# Patient Record
Sex: Female | Born: 1945 | Race: White | Hispanic: No
Health system: Southern US, Community
[De-identification: ages and names within clinical notes are randomized; demographics above are authoritative.]

---

## 2005-12-15 ENCOUNTER — Emergency Department: Payer: Self-pay | Admitting: Internal Medicine

## 2006-04-29 ENCOUNTER — Other Ambulatory Visit: Payer: Self-pay

## 2006-04-30 ENCOUNTER — Other Ambulatory Visit: Payer: Self-pay

## 2006-04-30 ENCOUNTER — Inpatient Hospital Stay: Payer: Self-pay | Admitting: Internal Medicine

## 2007-08-07 ENCOUNTER — Ambulatory Visit: Payer: Self-pay | Admitting: Family Medicine

## 2007-10-08 ENCOUNTER — Ambulatory Visit: Payer: Self-pay | Admitting: Family Medicine

## 2012-09-07 LAB — CBC WITH DIFFERENTIAL/PLATELET
Basophil #: 0.1 10*3/uL (ref 0.0–0.1)
Basophil %: 0.2 %
Eosinophil #: 0 10*3/uL (ref 0.0–0.7)
HCT: 44.9 % (ref 35.0–47.0)
HGB: 14.8 g/dL (ref 12.0–16.0)
Lymphocyte #: 0.6 10*3/uL — ABNORMAL LOW (ref 1.0–3.6)
Lymphocyte %: 2.7 %
MCH: 32.1 pg (ref 26.0–34.0)
MCHC: 32.9 g/dL (ref 32.0–36.0)
MCV: 98 fL (ref 80–100)
Monocyte %: 6.2 %
Neutrophil #: 18.9 10*3/uL — ABNORMAL HIGH (ref 1.4–6.5)
Neutrophil %: 90.9 %
RDW: 14.6 % — ABNORMAL HIGH (ref 11.5–14.5)
WBC: 20.8 10*3/uL — ABNORMAL HIGH (ref 3.6–11.0)

## 2012-09-07 LAB — COMPREHENSIVE METABOLIC PANEL
Alkaline Phosphatase: 88 U/L (ref 50–136)
Co2: 35 mmol/L — ABNORMAL HIGH (ref 21–32)
Creatinine: 0.84 mg/dL (ref 0.60–1.30)
EGFR (African American): >60
EGFR (Non-African Amer.): >60
Glucose: 167 mg/dL — ABNORMAL HIGH (ref 65–99)
Osmolality: 284 (ref 275–301)
Potassium: 3.8 mmol/L (ref 3.5–5.1)
SGPT (ALT): 39 U/L (ref 12–78)
Sodium: 140 mmol/L (ref 136–145)
Total Protein: 6.8 g/dL (ref 6.4–8.2)

## 2012-09-07 LAB — CK TOTAL AND CKMB (NOT AT ARMC): CK, Total: 59 U/L (ref 21–215)

## 2012-09-08 ENCOUNTER — Inpatient Hospital Stay: Payer: Self-pay | Admitting: Specialist

## 2012-09-08 LAB — URINALYSIS, COMPLETE
Blood: NEGATIVE
Glucose,UR: NEGATIVE mg/dL (ref 0–75)
Leukocyte Esterase: NEGATIVE
Nitrite: NEGATIVE
Ph: 7 (ref 4.5–8.0)
Protein: NEGATIVE
RBC,UR: 4 /HPF (ref 0–5)
Specific Gravity: 1.039 (ref 1.003–1.030)
Squamous Epithelial: 2
WBC UR: 1 /HPF (ref 0–5)

## 2012-09-08 LAB — T4, FREE: Free Thyroxine: 0.97 ng/dL (ref 0.76–1.46)

## 2012-09-09 LAB — CBC WITH DIFFERENTIAL/PLATELET
Basophil #: 0 10*3/uL (ref 0.0–0.1)
HCT: 41.5 % (ref 35.0–47.0)
HGB: 13.8 g/dL (ref 12.0–16.0)
Lymphocyte #: 0.3 10*3/uL — ABNORMAL LOW (ref 1.0–3.6)
Lymphocyte %: 2.5 %
MCH: 32.2 pg (ref 26.0–34.0)
MCHC: 33.2 g/dL (ref 32.0–36.0)
MCV: 97 fL (ref 80–100)
Monocyte %: 4.9 %
Neutrophil #: 11.3 10*3/uL — ABNORMAL HIGH (ref 1.4–6.5)
Neutrophil %: 92.6 %
WBC: 12.2 10*3/uL — ABNORMAL HIGH (ref 3.6–11.0)

## 2012-09-09 LAB — BASIC METABOLIC PANEL
Anion Gap: 5 — ABNORMAL LOW (ref 7–16)
BUN: 15 mg/dL (ref 7–18)
Chloride: 95 mmol/L — ABNORMAL LOW (ref 98–107)
Co2: 37 mmol/L — ABNORMAL HIGH (ref 21–32)
Glucose: 143 mg/dL — ABNORMAL HIGH (ref 65–99)
Osmolality: 277 (ref 275–301)
Sodium: 137 mmol/L (ref 136–145)

## 2012-12-25 ENCOUNTER — Ambulatory Visit: Payer: Self-pay | Admitting: Emergency Medicine

## 2013-02-14 ENCOUNTER — Inpatient Hospital Stay: Payer: Self-pay | Admitting: Family Medicine

## 2013-02-14 LAB — COMPREHENSIVE METABOLIC PANEL
Albumin: 3.1 g/dL — ABNORMAL LOW (ref 3.4–5.0)
Alkaline Phosphatase: 197 U/L — ABNORMAL HIGH (ref 50–136)
Anion Gap: 5 — ABNORMAL LOW (ref 7–16)
Bilirubin,Total: 0.7 mg/dL (ref 0.2–1.0)
Calcium, Total: 9.1 mg/dL (ref 8.5–10.1)
Chloride: 92 mmol/L — ABNORMAL LOW (ref 98–107)
Creatinine: 0.87 mg/dL (ref 0.60–1.30)
EGFR (African American): >60
Glucose: 150 mg/dL — ABNORMAL HIGH (ref 65–99)
Potassium: 2.9 mmol/L — ABNORMAL LOW (ref 3.5–5.1)

## 2013-02-14 LAB — CBC
HCT: 41.5 % (ref 35.0–47.0)
HGB: 14.3 g/dL (ref 12.0–16.0)
MCHC: 34.3 g/dL (ref 32.0–36.0)
MCV: 94 fL (ref 80–100)
RDW: 15.2 % — ABNORMAL HIGH (ref 11.5–14.5)

## 2013-02-15 LAB — CBC WITH DIFFERENTIAL/PLATELET
Basophil #: 0 10*3/uL (ref 0.0–0.1)
Basophil %: 0.1 %
Eosinophil %: 0 %
HGB: 12.7 g/dL (ref 12.0–16.0)
Lymphocyte %: 1.6 %
MCH: 32.2 pg (ref 26.0–34.0)
MCV: 96 fL (ref 80–100)
Monocyte #: 0.4 x10 3/mm (ref 0.2–0.9)
Neutrophil %: 95.6 %
Platelet: 314 10*3/uL (ref 150–440)
RBC: 3.94 10*6/uL (ref 3.80–5.20)
WBC: 13.9 10*3/uL — ABNORMAL HIGH (ref 3.6–11.0)

## 2013-02-15 LAB — BASIC METABOLIC PANEL
Anion Gap: 5 — ABNORMAL LOW (ref 7–16)
Chloride: 101 mmol/L (ref 98–107)
EGFR (African American): >60
EGFR (Non-African Amer.): >60
Osmolality: 280 (ref 275–301)
Potassium: 4.4 mmol/L (ref 3.5–5.1)

## 2013-02-17 LAB — BASIC METABOLIC PANEL
BUN: 16 mg/dL (ref 7–18)
Calcium, Total: 8.8 mg/dL (ref 8.5–10.1)
Chloride: 101 mmol/L (ref 98–107)
Co2: 34 mmol/L — ABNORMAL HIGH (ref 21–32)
EGFR (African American): >60
EGFR (Non-African Amer.): >60
Glucose: 137 mg/dL — ABNORMAL HIGH (ref 65–99)
Osmolality: 279 (ref 275–301)
Potassium: 4.5 mmol/L (ref 3.5–5.1)
Sodium: 138 mmol/L (ref 136–145)

## 2013-02-17 LAB — CBC WITH DIFFERENTIAL/PLATELET
Basophil %: 0.1 %
Eosinophil #: 0 10*3/uL (ref 0.0–0.7)
Eosinophil %: 0 %
HCT: 37.7 % (ref 35.0–47.0)
HGB: 12.8 g/dL (ref 12.0–16.0)
Lymphocyte #: 0.2 10*3/uL — ABNORMAL LOW (ref 1.0–3.6)
MCHC: 33.8 g/dL (ref 32.0–36.0)
Monocyte %: 2.8 %
Neutrophil #: 13.4 10*3/uL — ABNORMAL HIGH (ref 1.4–6.5)
RBC: 3.97 10*6/uL (ref 3.80–5.20)

## 2013-02-17 LAB — PRO B NATRIURETIC PEPTIDE: B-Type Natriuretic Peptide: 2290 pg/mL — ABNORMAL HIGH (ref 0–125)

## 2013-02-19 LAB — CBC WITH DIFFERENTIAL/PLATELET
Basophil #: 0 10*3/uL (ref 0.0–0.1)
Basophil %: 0.2 %
Eosinophil #: 0 10*3/uL (ref 0.0–0.7)
HCT: 37.5 % (ref 35.0–47.0)
HGB: 12.7 g/dL (ref 12.0–16.0)
Lymphocyte #: 0.8 10*3/uL — ABNORMAL LOW (ref 1.0–3.6)
MCHC: 33.8 g/dL (ref 32.0–36.0)
MCV: 94 fL (ref 80–100)
Monocyte #: 0.8 x10 3/mm (ref 0.2–0.9)
Monocyte %: 6 %
Neutrophil #: 11.4 10*3/uL — ABNORMAL HIGH (ref 1.4–6.5)
Neutrophil %: 87.9 %
Platelet: 269 10*3/uL (ref 150–440)
RDW: 15.5 % — ABNORMAL HIGH (ref 11.5–14.5)
WBC: 12.9 10*3/uL — ABNORMAL HIGH (ref 3.6–11.0)

## 2013-02-19 LAB — BASIC METABOLIC PANEL
Anion Gap: 5 — ABNORMAL LOW (ref 7–16)
BUN: 17 mg/dL (ref 7–18)
Calcium, Total: 8.9 mg/dL (ref 8.5–10.1)
Chloride: 102 mmol/L (ref 98–107)
Co2: 32 mmol/L (ref 21–32)
EGFR (African American): >60
EGFR (Non-African Amer.): >60
Glucose: 102 mg/dL — ABNORMAL HIGH (ref 65–99)
Osmolality: 279 (ref 275–301)
Potassium: 3.5 mmol/L (ref 3.5–5.1)
Sodium: 139 mmol/L (ref 136–145)

## 2013-02-20 LAB — PATHOLOGY REPORT

## 2013-03-05 ENCOUNTER — Inpatient Hospital Stay: Payer: Self-pay | Admitting: Orthopedic Surgery

## 2013-03-05 LAB — URINALYSIS, COMPLETE
Bacteria: NONE SEEN
Bilirubin,UR: NEGATIVE
Blood: NEGATIVE
Glucose,UR: NEGATIVE mg/dL (ref 0–75)
Hyaline Cast: 2
Ketone: NEGATIVE
Leukocyte Esterase: NEGATIVE
RBC,UR: 7 /HPF (ref 0–5)

## 2013-03-05 LAB — CBC
HCT: 32.6 % — ABNORMAL LOW (ref 35.0–47.0)
HGB: 11.2 g/dL — ABNORMAL LOW (ref 12.0–16.0)
MCHC: 34.4 g/dL (ref 32.0–36.0)
MCV: 96 fL (ref 80–100)
RDW: 15.2 % — ABNORMAL HIGH (ref 11.5–14.5)

## 2013-03-05 LAB — COMPREHENSIVE METABOLIC PANEL
Alkaline Phosphatase: 174 U/L — ABNORMAL HIGH (ref 50–136)
Bilirubin,Total: 0.8 mg/dL (ref 0.2–1.0)
Calcium, Total: 9 mg/dL (ref 8.5–10.1)
Co2: 31 mmol/L (ref 21–32)
EGFR (Non-African Amer.): >60
Glucose: 139 mg/dL — ABNORMAL HIGH (ref 65–99)
SGPT (ALT): 26 U/L (ref 12–78)

## 2013-03-05 LAB — TROPONIN I: Troponin-I: <0.02 ng/mL

## 2013-03-05 LAB — PROTIME-INR: INR: 1

## 2013-03-06 LAB — CBC WITH DIFFERENTIAL/PLATELET
Basophil %: 0.2 %
Eosinophil %: 0.8 %
HCT: 29.3 % — ABNORMAL LOW (ref 35.0–47.0)
Lymphocyte #: 0.7 10*3/uL — ABNORMAL LOW (ref 1.0–3.6)
Lymphocyte %: 8.8 %
MCH: 32.5 pg (ref 26.0–34.0)
MCHC: 33.7 g/dL (ref 32.0–36.0)
MCV: 97 fL (ref 80–100)
Monocyte #: 0.6 x10 3/mm (ref 0.2–0.9)
Monocyte %: 6.7 %
Neutrophil #: 7 10*3/uL — ABNORMAL HIGH (ref 1.4–6.5)
Neutrophil %: 83.5 %
RBC: 3.04 10*6/uL — ABNORMAL LOW (ref 3.80–5.20)
RDW: 15.1 % — ABNORMAL HIGH (ref 11.5–14.5)
WBC: 8.4 10*3/uL (ref 3.6–11.0)

## 2013-03-06 LAB — TROPONIN I: Troponin-I: <0.02 ng/mL

## 2013-03-06 LAB — BASIC METABOLIC PANEL
Anion Gap: 4 — ABNORMAL LOW (ref 7–16)
Chloride: 106 mmol/L (ref 98–107)
Co2: 33 mmol/L — ABNORMAL HIGH (ref 21–32)
Creatinine: 0.66 mg/dL (ref 0.60–1.30)
EGFR (African American): >60
Glucose: 98 mg/dL (ref 65–99)
Sodium: 143 mmol/L (ref 136–145)

## 2013-03-07 LAB — PATHOLOGY REPORT

## 2013-03-07 LAB — CBC WITH DIFFERENTIAL/PLATELET
Eosinophil #: 0 10*3/uL (ref 0.0–0.7)
Eosinophil %: 0 %
HCT: 25.6 % — ABNORMAL LOW (ref 35.0–47.0)
HGB: 8.9 g/dL — ABNORMAL LOW (ref 12.0–16.0)
Lymphocyte #: 0.3 10*3/uL — ABNORMAL LOW (ref 1.0–3.6)
Lymphocyte %: 2.7 %
MCV: 96 fL (ref 80–100)
Monocyte #: 0.6 x10 3/mm (ref 0.2–0.9)
Platelet: 191 10*3/uL (ref 150–440)
WBC: 10.2 10*3/uL (ref 3.6–11.0)

## 2013-03-07 LAB — BASIC METABOLIC PANEL
Anion Gap: 4 — ABNORMAL LOW (ref 7–16)
Calcium, Total: 8.4 mg/dL — ABNORMAL LOW (ref 8.5–10.1)
Chloride: 106 mmol/L (ref 98–107)
Co2: 31 mmol/L (ref 21–32)
EGFR (African American): >60
Potassium: 4.4 mmol/L (ref 3.5–5.1)
Sodium: 141 mmol/L (ref 136–145)

## 2013-03-08 LAB — HEMOGLOBIN: HGB: 7.7 g/dL — ABNORMAL LOW (ref 12.0–16.0)

## 2013-04-30 ENCOUNTER — Ambulatory Visit: Payer: Self-pay | Admitting: Internal Medicine

## 2013-05-05 ENCOUNTER — Emergency Department: Payer: Self-pay | Admitting: Emergency Medicine

## 2013-05-05 LAB — CBC
HCT: 42.2 % (ref 35.0–47.0)
MCH: 31.2 pg (ref 26.0–34.0)
MCHC: 33.2 g/dL (ref 32.0–36.0)
MCV: 94 fL (ref 80–100)
Platelet: 329 10*3/uL (ref 150–440)
WBC: 10 10*3/uL (ref 3.6–11.0)

## 2013-05-05 LAB — COMPREHENSIVE METABOLIC PANEL
Albumin: 3.4 g/dL (ref 3.4–5.0)
Alkaline Phosphatase: 150 U/L — ABNORMAL HIGH
Anion Gap: 8 (ref 7–16)
BUN: 12 mg/dL (ref 7–18)
Calcium, Total: 9.2 mg/dL (ref 8.5–10.1)
Chloride: 95 mmol/L — ABNORMAL LOW (ref 98–107)
EGFR (African American): >60
Osmolality: 275 (ref 275–301)
SGOT(AST): 9 U/L — ABNORMAL LOW (ref 15–37)
SGPT (ALT): 18 U/L (ref 12–78)

## 2013-05-05 LAB — CK TOTAL AND CKMB (NOT AT ARMC): CK, Total: 50 U/L (ref 21–215)

## 2013-05-05 LAB — TROPONIN I: Troponin-I: <0.02 ng/mL

## 2013-05-19 ENCOUNTER — Inpatient Hospital Stay: Payer: Self-pay | Admitting: Student

## 2013-05-19 DIAGNOSIS — I498 Other specified cardiac arrhythmias: Secondary | ICD-10-CM

## 2013-05-19 LAB — MAGNESIUM: Magnesium: 1.9 mg/dL

## 2013-05-19 LAB — CK TOTAL AND CKMB (NOT AT ARMC)
CK-MB: 4.1 ng/mL — ABNORMAL HIGH (ref 0.5–3.6)
CK-MB: 4.8 ng/mL — ABNORMAL HIGH (ref 0.5–3.6)

## 2013-05-19 LAB — CBC
HCT: 45.1 % (ref 35.0–47.0)
HGB: 14.3 g/dL (ref 12.0–16.0)
MCH: 29.6 pg (ref 26.0–34.0)
MCHC: 31.7 g/dL — ABNORMAL LOW (ref 32.0–36.0)
MCV: 94 fL (ref 80–100)
RDW: 14.8 % — ABNORMAL HIGH (ref 11.5–14.5)
WBC: 13.2 10*3/uL — ABNORMAL HIGH (ref 3.6–11.0)

## 2013-05-19 LAB — TROPONIN I
Troponin-I: <0.02 ng/mL
Troponin-I: 0.02 ng/mL
Troponin-I: <0.02 ng/mL

## 2013-05-19 LAB — BASIC METABOLIC PANEL
Calcium, Total: 9 mg/dL (ref 8.5–10.1)
EGFR (African American): >60
EGFR (Non-African Amer.): >60
Osmolality: 282 (ref 275–301)
Potassium: 2.8 mmol/L — ABNORMAL LOW (ref 3.5–5.1)

## 2013-05-19 LAB — POTASSIUM
Potassium: 2.9 mmol/L — ABNORMAL LOW (ref 3.5–5.1)
Potassium: 3.2 mmol/L — ABNORMAL LOW (ref 3.5–5.1)

## 2013-05-20 LAB — BASIC METABOLIC PANEL
Anion Gap: 3 — ABNORMAL LOW (ref 7–16)
BUN: 12 mg/dL (ref 7–18)
Co2: 35 mmol/L — ABNORMAL HIGH (ref 21–32)
Creatinine: 0.66 mg/dL (ref 0.60–1.30)
EGFR (African American): >60
EGFR (Non-African Amer.): >60
Glucose: 179 mg/dL — ABNORMAL HIGH (ref 65–99)
Osmolality: 284 (ref 275–301)
Potassium: 3.6 mmol/L (ref 3.5–5.1)

## 2013-05-20 LAB — CBC WITH DIFFERENTIAL/PLATELET
Eosinophil #: 0 10*3/uL (ref 0.0–0.7)
Eosinophil %: 0 %
HCT: 39.7 % (ref 35.0–47.0)
HGB: 13 g/dL (ref 12.0–16.0)
Lymphocyte #: 0.5 10*3/uL — ABNORMAL LOW (ref 1.0–3.6)
Lymphocyte %: 4.1 %
MCH: 31 pg (ref 26.0–34.0)
MCHC: 32.8 g/dL (ref 32.0–36.0)
MCV: 94 fL (ref 80–100)
Monocyte %: 4 %
Neutrophil #: 10.7 10*3/uL — ABNORMAL HIGH (ref 1.4–6.5)
Neutrophil %: 91.7 %
Platelet: 308 10*3/uL (ref 150–440)
RDW: 15 % — ABNORMAL HIGH (ref 11.5–14.5)

## 2013-05-20 LAB — MAGNESIUM: Magnesium: 1.9 mg/dL

## 2013-05-20 LAB — TSH: Thyroid Stimulating Horm: 0.327 u[IU]/mL — ABNORMAL LOW

## 2013-05-21 DIAGNOSIS — J9600 Acute respiratory failure, unspecified whether with hypoxia or hypercapnia: Secondary | ICD-10-CM

## 2013-05-21 DIAGNOSIS — J441 Chronic obstructive pulmonary disease with (acute) exacerbation: Secondary | ICD-10-CM

## 2013-05-21 LAB — EXPECTORATED SPUTUM ASSESSMENT W REFEX TO RESP CULTURE

## 2013-05-22 LAB — CBC WITH DIFFERENTIAL/PLATELET
Basophil #: 0.1 10*3/uL (ref 0.0–0.1)
Basophil %: 0.7 %
Eosinophil #: 0 10*3/uL (ref 0.0–0.7)
HCT: 36 % (ref 35.0–47.0)
HGB: 11.8 g/dL — ABNORMAL LOW (ref 12.0–16.0)
Lymphocyte #: 0.2 10*3/uL — ABNORMAL LOW (ref 1.0–3.6)
Lymphocyte %: 1.7 %
MCH: 30.9 pg (ref 26.0–34.0)
MCHC: 32.9 g/dL (ref 32.0–36.0)
Monocyte #: 0.3 x10 3/mm (ref 0.2–0.9)
Monocyte %: 2.3 %
Neutrophil #: 11.1 10*3/uL — ABNORMAL HIGH (ref 1.4–6.5)
Neutrophil %: 95.3 %
Platelet: 191 10*3/uL (ref 150–440)
RBC: 3.84 10*6/uL (ref 3.80–5.20)
RDW: 14.6 % — ABNORMAL HIGH (ref 11.5–14.5)
WBC: 11.6 10*3/uL — ABNORMAL HIGH (ref 3.6–11.0)

## 2013-05-22 LAB — BASIC METABOLIC PANEL
Anion Gap: 2 — ABNORMAL LOW (ref 7–16)
BUN: 27 mg/dL — ABNORMAL HIGH (ref 7–18)
Calcium, Total: 8.7 mg/dL (ref 8.5–10.1)
Co2: 38 mmol/L — ABNORMAL HIGH (ref 21–32)
Creatinine: 0.49 mg/dL — ABNORMAL LOW (ref 0.60–1.30)
EGFR (African American): >60
EGFR (Non-African Amer.): >60
Glucose: 188 mg/dL — ABNORMAL HIGH (ref 65–99)
Potassium: 4.3 mmol/L (ref 3.5–5.1)
Sodium: 139 mmol/L (ref 136–145)

## 2013-05-22 LAB — MAGNESIUM: Magnesium: 2.1 mg/dL

## 2013-05-23 LAB — CBC WITH DIFFERENTIAL/PLATELET
Basophil #: 0 10*3/uL (ref 0.0–0.1)
Basophil %: 0.1 %
Eosinophil #: 0 10*3/uL (ref 0.0–0.7)
Eosinophil %: 0 %
HCT: 37.3 % (ref 35.0–47.0)
HGB: 12 g/dL (ref 12.0–16.0)
Lymphocyte #: 0.1 10*3/uL — ABNORMAL LOW (ref 1.0–3.6)
Lymphocyte %: 1.6 %
MCHC: 32.1 g/dL (ref 32.0–36.0)
MCV: 93 fL (ref 80–100)
Monocyte #: 0.2 x10 3/mm (ref 0.2–0.9)
Monocyte %: 2.4 %
Neutrophil #: 8.5 10*3/uL — ABNORMAL HIGH (ref 1.4–6.5)
Neutrophil %: 95.9 %
WBC: 8.9 10*3/uL (ref 3.6–11.0)

## 2013-05-23 LAB — BASIC METABOLIC PANEL
BUN: 28 mg/dL — ABNORMAL HIGH (ref 7–18)
Calcium, Total: 7.9 mg/dL — ABNORMAL LOW (ref 8.5–10.1)
Chloride: 100 mmol/L (ref 98–107)
Co2: 37 mmol/L — ABNORMAL HIGH (ref 21–32)
Creatinine: 0.52 mg/dL — ABNORMAL LOW (ref 0.60–1.30)
EGFR (Non-African Amer.): >60
Glucose: 201 mg/dL — ABNORMAL HIGH (ref 65–99)
Osmolality: 289 (ref 275–301)
Potassium: 4.1 mmol/L (ref 3.5–5.1)

## 2013-05-24 LAB — CBC WITH DIFFERENTIAL/PLATELET
Eosinophil %: 0 %
HCT: 34.5 % — ABNORMAL LOW (ref 35.0–47.0)
HGB: 11.5 g/dL — ABNORMAL LOW (ref 12.0–16.0)
Lymphocyte #: 0.1 10*3/uL — ABNORMAL LOW (ref 1.0–3.6)
MCH: 31.3 pg (ref 26.0–34.0)
MCHC: 33.3 g/dL (ref 32.0–36.0)
MCV: 94 fL (ref 80–100)
Monocyte %: 3 %
RDW: 15 % — ABNORMAL HIGH (ref 11.5–14.5)
WBC: 12.6 10*3/uL — ABNORMAL HIGH (ref 3.6–11.0)

## 2013-05-24 LAB — BASIC METABOLIC PANEL
Anion Gap: 4 — ABNORMAL LOW (ref 7–16)
BUN: 25 mg/dL — ABNORMAL HIGH (ref 7–18)
Chloride: 100 mmol/L (ref 98–107)
Chloride: 99 mmol/L (ref 98–107)
Co2: 37 mmol/L — ABNORMAL HIGH (ref 21–32)
Co2: 40 mmol/L (ref 21–32)
Creatinine: 0.39 mg/dL — ABNORMAL LOW (ref 0.60–1.30)
Creatinine: 0.42 mg/dL — ABNORMAL LOW (ref 0.60–1.30)
EGFR (African American): >60
EGFR (Non-African Amer.): >60
Osmolality: 290 (ref 275–301)
Potassium: 3.5 mmol/L (ref 3.5–5.1)
Sodium: 140 mmol/L (ref 136–145)

## 2013-05-24 LAB — MAGNESIUM
Magnesium: 2.1 mg/dL
Magnesium: 2.3 mg/dL

## 2013-05-24 LAB — CULTURE, BLOOD (SINGLE)

## 2013-05-24 LAB — PHOSPHORUS: Phosphorus: 3.3 mg/dL (ref 2.5–4.9)

## 2013-05-25 LAB — CBC WITH DIFFERENTIAL/PLATELET
Basophil #: 0 10*3/uL (ref 0.0–0.1)
Eosinophil #: 0 10*3/uL (ref 0.0–0.7)
Eosinophil %: 0 %
HCT: 34.5 % — ABNORMAL LOW (ref 35.0–47.0)
MCH: 31 pg (ref 26.0–34.0)
MCHC: 33.1 g/dL (ref 32.0–36.0)
MCV: 94 fL (ref 80–100)
Monocyte %: 3.1 %
RBC: 3.69 10*6/uL — ABNORMAL LOW (ref 3.80–5.20)
RDW: 14.8 % — ABNORMAL HIGH (ref 11.5–14.5)

## 2013-05-25 LAB — BASIC METABOLIC PANEL
Anion Gap: 0 — ABNORMAL LOW (ref 7–16)
BUN: 34 mg/dL — ABNORMAL HIGH (ref 7–18)
Calcium, Total: 8.3 mg/dL — ABNORMAL LOW (ref 8.5–10.1)
Chloride: 99 mmol/L (ref 98–107)
Co2: 41 mmol/L (ref 21–32)
Creatinine: 0.42 mg/dL — ABNORMAL LOW (ref 0.60–1.30)
EGFR (African American): >60
EGFR (Non-African Amer.): >60
Glucose: 156 mg/dL — ABNORMAL HIGH (ref 65–99)
Potassium: 4.4 mmol/L (ref 3.5–5.1)
Sodium: 140 mmol/L (ref 136–145)

## 2013-05-25 LAB — MAGNESIUM: Magnesium: 2.1 mg/dL

## 2013-05-26 DIAGNOSIS — I498 Other specified cardiac arrhythmias: Secondary | ICD-10-CM

## 2013-05-26 DIAGNOSIS — J441 Chronic obstructive pulmonary disease with (acute) exacerbation: Secondary | ICD-10-CM

## 2013-05-26 DIAGNOSIS — J9620 Acute and chronic respiratory failure, unspecified whether with hypoxia or hypercapnia: Secondary | ICD-10-CM

## 2013-05-26 LAB — ALBUMIN: Albumin: 2.4 g/dL — ABNORMAL LOW (ref 3.4–5.0)

## 2013-05-26 LAB — PHOSPHORUS: Phosphorus: 3.3 mg/dL (ref 2.5–4.9)

## 2013-05-27 LAB — MAGNESIUM: Magnesium: 2 mg/dL

## 2013-05-27 LAB — SODIUM: Sodium: 139 mmol/L (ref 136–145)

## 2013-05-28 LAB — CBC WITH DIFFERENTIAL/PLATELET
Basophil %: 0.6 %
Eosinophil %: 0.1 %
HCT: 39.8 % (ref 35.0–47.0)
HGB: 13.1 g/dL (ref 12.0–16.0)
Lymphocyte #: 0.1 10*3/uL — ABNORMAL LOW (ref 1.0–3.6)
Lymphocyte %: 0.8 %
MCH: 31 pg (ref 26.0–34.0)
MCHC: 32.9 g/dL (ref 32.0–36.0)
MCV: 94 fL (ref 80–100)
Monocyte #: 0.7 x10 3/mm (ref 0.2–0.9)
Neutrophil #: 16.6 10*3/uL — ABNORMAL HIGH (ref 1.4–6.5)
Neutrophil %: 94.6 %
Platelet: 179 10*3/uL (ref 150–440)
RBC: 4.23 10*6/uL (ref 3.80–5.20)

## 2013-05-28 LAB — BASIC METABOLIC PANEL
BUN: 26 mg/dL — ABNORMAL HIGH (ref 7–18)
Calcium, Total: 8.3 mg/dL — ABNORMAL LOW (ref 8.5–10.1)
Chloride: 99 mmol/L (ref 98–107)
Co2: 36 mmol/L — ABNORMAL HIGH (ref 21–32)
Creatinine: 0.41 mg/dL — ABNORMAL LOW (ref 0.60–1.30)
EGFR (African American): >60
EGFR (Non-African Amer.): >60
Glucose: 168 mg/dL — ABNORMAL HIGH (ref 65–99)
Osmolality: 284 (ref 275–301)
Potassium: 3.5 mmol/L (ref 3.5–5.1)

## 2013-05-28 LAB — PHOSPHORUS: Phosphorus: 3.5 mg/dL (ref 2.5–4.9)

## 2013-05-29 LAB — MAGNESIUM: Magnesium: 2.1 mg/dL

## 2013-05-29 LAB — PHOSPHORUS: Phosphorus: 3.3 mg/dL (ref 2.5–4.9)

## 2013-05-30 LAB — BASIC METABOLIC PANEL
Anion Gap: 4 — ABNORMAL LOW (ref 7–16)
BUN: 25 mg/dL — ABNORMAL HIGH (ref 7–18)
Calcium, Total: 8.4 mg/dL — ABNORMAL LOW (ref 8.5–10.1)
Chloride: 97 mmol/L — ABNORMAL LOW (ref 98–107)
Co2: 39 mmol/L — ABNORMAL HIGH (ref 21–32)
Creatinine: 0.41 mg/dL — ABNORMAL LOW (ref 0.60–1.30)
EGFR (African American): >60
EGFR (Non-African Amer.): >60
Osmolality: 292 (ref 275–301)
Sodium: 140 mmol/L (ref 136–145)

## 2013-05-30 LAB — PHOSPHORUS: Phosphorus: 3.5 mg/dL (ref 2.5–4.9)

## 2013-05-30 LAB — CBC WITH DIFFERENTIAL/PLATELET
Basophil #: 0.1 10*3/uL (ref 0.0–0.1)
Basophil %: 0.5 %
Eosinophil %: 0 %
HGB: 12.8 g/dL (ref 12.0–16.0)
Lymphocyte %: 0.5 %
MCH: 31 pg (ref 26.0–34.0)
Monocyte %: 2 %
Neutrophil #: 22.4 10*3/uL — ABNORMAL HIGH (ref 1.4–6.5)
Neutrophil %: 97 %
RBC: 4.14 10*6/uL (ref 3.80–5.20)
RDW: 15.5 % — ABNORMAL HIGH (ref 11.5–14.5)
WBC: 23.1 10*3/uL — ABNORMAL HIGH (ref 3.6–11.0)

## 2013-05-30 LAB — MAGNESIUM: Magnesium: 2.2 mg/dL

## 2013-05-30 LAB — POTASSIUM: Potassium: 4.7 mmol/L (ref 3.5–5.1)

## 2013-05-31 ENCOUNTER — Ambulatory Visit: Payer: Self-pay | Admitting: Internal Medicine

## 2013-05-31 DIAGNOSIS — I498 Other specified cardiac arrhythmias: Secondary | ICD-10-CM

## 2013-05-31 DIAGNOSIS — J441 Chronic obstructive pulmonary disease with (acute) exacerbation: Secondary | ICD-10-CM

## 2013-05-31 DIAGNOSIS — J9620 Acute and chronic respiratory failure, unspecified whether with hypoxia or hypercapnia: Secondary | ICD-10-CM

## 2013-06-01 LAB — BASIC METABOLIC PANEL
Anion Gap: 4 — ABNORMAL LOW (ref 7–16)
BUN: 26 mg/dL — ABNORMAL HIGH (ref 7–18)
CALCIUM: 8.9 mg/dL (ref 8.5–10.1)
CHLORIDE: 89 mmol/L — AB (ref 98–107)
CO2: 44 mmol/L — AB (ref 21–32)
CREATININE: 0.33 mg/dL — AB (ref 0.60–1.30)
EGFR (African American): >60
EGFR (Non-African Amer.): >60
GLUCOSE: 199 mg/dL — AB (ref 65–99)
Osmolality: 284 (ref 275–301)
Potassium: 3.8 mmol/L (ref 3.5–5.1)
Sodium: 137 mmol/L (ref 136–145)

## 2013-06-01 LAB — MAGNESIUM: MAGNESIUM: 2.1 mg/dL

## 2013-06-01 LAB — PHOSPHORUS: Phosphorus: 3.4 mg/dL (ref 2.5–4.9)

## 2013-06-02 DIAGNOSIS — I5033 Acute on chronic diastolic (congestive) heart failure: Secondary | ICD-10-CM

## 2013-06-02 LAB — CBC WITH DIFFERENTIAL/PLATELET
Basophil #: 0.2 10*3/uL — ABNORMAL HIGH (ref 0.0–0.1)
Basophil %: 1.1 %
EOS ABS: 0 10*3/uL (ref 0.0–0.7)
Eosinophil %: 0 %
HCT: 37.3 % (ref 35.0–47.0)
HGB: 12 g/dL (ref 12.0–16.0)
Lymphocyte #: 0.2 10*3/uL — ABNORMAL LOW (ref 1.0–3.6)
Lymphocyte %: 0.7 %
MCH: 30.1 pg (ref 26.0–34.0)
MCHC: 32.2 g/dL (ref 32.0–36.0)
MCV: 94 fL (ref 80–100)
MONOS PCT: 3.6 %
Monocyte #: 0.8 x10 3/mm (ref 0.2–0.9)
Neutrophil #: 20.8 10*3/uL — ABNORMAL HIGH (ref 1.4–6.5)
Neutrophil %: 94.6 %
Platelet: 212 10*3/uL (ref 150–440)
RBC: 3.99 10*6/uL (ref 3.80–5.20)
RDW: 15.2 % — AB (ref 11.5–14.5)
WBC: 22 10*3/uL — AB (ref 3.6–11.0)

## 2013-06-02 LAB — BASIC METABOLIC PANEL
BUN: 28 mg/dL — AB (ref 7–18)
CHLORIDE: 87 mmol/L — AB (ref 98–107)
Calcium, Total: 8.6 mg/dL (ref 8.5–10.1)
Creatinine: 0.35 mg/dL — ABNORMAL LOW (ref 0.60–1.30)
EGFR (Non-African Amer.): >60
Glucose: 161 mg/dL — ABNORMAL HIGH (ref 65–99)
OSMOLALITY: 283 (ref 275–301)
POTASSIUM: 3 mmol/L — AB (ref 3.5–5.1)
Sodium: 137 mmol/L (ref 136–145)

## 2013-06-02 LAB — MAGNESIUM: Magnesium: 2 mg/dL

## 2013-06-02 LAB — PHOSPHORUS: Phosphorus: 3.5 mg/dL (ref 2.5–4.9)

## 2013-06-03 LAB — CBC WITH DIFFERENTIAL/PLATELET
Basophil #: 0.1 10*3/uL (ref 0.0–0.1)
Basophil %: 0.3 %
Eosinophil #: 0 10*3/uL (ref 0.0–0.7)
Eosinophil %: 0.1 %
HCT: 42.3 % (ref 35.0–47.0)
HGB: 14 g/dL (ref 12.0–16.0)
Lymphocyte #: 0.2 10*3/uL — ABNORMAL LOW (ref 1.0–3.6)
Lymphocyte %: 0.8 %
MCH: 30.3 pg (ref 26.0–34.0)
MCHC: 33.2 g/dL (ref 32.0–36.0)
MCV: 91 fL (ref 80–100)
MONO ABS: 0.8 x10 3/mm (ref 0.2–0.9)
Monocyte %: 2.9 %
NEUTROS PCT: 95.9 %
Neutrophil #: 25.2 10*3/uL — ABNORMAL HIGH (ref 1.4–6.5)
Platelet: 253 10*3/uL (ref 150–440)
RBC: 4.63 10*6/uL (ref 3.80–5.20)
RDW: 15.3 % — ABNORMAL HIGH (ref 11.5–14.5)
WBC: 26.3 10*3/uL — ABNORMAL HIGH (ref 3.6–11.0)

## 2013-06-03 LAB — POTASSIUM: Potassium: 2.7 mmol/L — ABNORMAL LOW (ref 3.5–5.1)

## 2013-06-03 LAB — MAGNESIUM: Magnesium: 2 mg/dL

## 2013-06-03 LAB — SODIUM: Sodium: 137 mmol/L (ref 136–145)

## 2013-06-03 LAB — PHOSPHORUS: Phosphorus: 3.1 mg/dL (ref 2.5–4.9)

## 2013-06-04 LAB — CBC WITH DIFFERENTIAL/PLATELET
BASOS ABS: 0.1 10*3/uL (ref 0.0–0.1)
BASOS PCT: 0.6 %
EOS PCT: 0 %
Eosinophil #: 0 10*3/uL (ref 0.0–0.7)
HCT: 38.9 % (ref 35.0–47.0)
HGB: 12.9 g/dL (ref 12.0–16.0)
Lymphocyte #: 0.1 10*3/uL — ABNORMAL LOW (ref 1.0–3.6)
Lymphocyte %: 0.3 %
MCH: 30.8 pg (ref 26.0–34.0)
MCHC: 33.3 g/dL (ref 32.0–36.0)
MCV: 93 fL (ref 80–100)
MONO ABS: 0.3 x10 3/mm (ref 0.2–0.9)
Monocyte %: 1.6 %
NEUTROS PCT: 97.5 %
Neutrophil #: 20.9 10*3/uL — ABNORMAL HIGH (ref 1.4–6.5)
PLATELETS: 196 10*3/uL (ref 150–440)
RBC: 4.2 10*6/uL (ref 3.80–5.20)
RDW: 15.8 % — AB (ref 11.5–14.5)
WBC: 21.5 10*3/uL — ABNORMAL HIGH (ref 3.6–11.0)

## 2013-06-04 LAB — BASIC METABOLIC PANEL
Anion Gap: 2 — ABNORMAL LOW (ref 7–16)
BUN: 21 mg/dL — ABNORMAL HIGH (ref 7–18)
CALCIUM: 8.4 mg/dL — AB (ref 8.5–10.1)
CHLORIDE: 96 mmol/L — AB (ref 98–107)
Co2: 42 mmol/L (ref 21–32)
Creatinine: 0.32 mg/dL — ABNORMAL LOW (ref 0.60–1.30)
EGFR (Non-African Amer.): >60
GLUCOSE: 198 mg/dL — AB (ref 65–99)
OSMOLALITY: 288 (ref 275–301)
Potassium: 3.7 mmol/L (ref 3.5–5.1)
Sodium: 140 mmol/L (ref 136–145)

## 2013-06-04 LAB — MAGNESIUM: Magnesium: 2.2 mg/dL

## 2013-06-04 LAB — PHOSPHORUS: Phosphorus: 3.7 mg/dL (ref 2.5–4.9)

## 2013-06-05 LAB — CBC WITH DIFFERENTIAL/PLATELET
BASOS ABS: 0.1 10*3/uL (ref 0.0–0.1)
Basophil %: 0.3 %
Eosinophil #: 0 10*3/uL (ref 0.0–0.7)
Eosinophil %: 0 %
HCT: 35.7 % (ref 35.0–47.0)
HGB: 11.8 g/dL — ABNORMAL LOW (ref 12.0–16.0)
LYMPHS PCT: 0.3 %
Lymphocyte #: 0.1 10*3/uL — ABNORMAL LOW (ref 1.0–3.6)
MCH: 30.7 pg (ref 26.0–34.0)
MCHC: 33 g/dL (ref 32.0–36.0)
MCV: 93 fL (ref 80–100)
MONO ABS: 0.2 x10 3/mm (ref 0.2–0.9)
Monocyte %: 1.1 %
Neutrophil #: 20.9 10*3/uL — ABNORMAL HIGH (ref 1.4–6.5)
Neutrophil %: 98.3 %
PLATELETS: 172 10*3/uL (ref 150–440)
RBC: 3.84 10*6/uL (ref 3.80–5.20)
RDW: 15.6 % — AB (ref 11.5–14.5)
WBC: 21.3 10*3/uL — ABNORMAL HIGH (ref 3.6–11.0)

## 2013-06-05 LAB — POTASSIUM: Potassium: 3.5 mmol/L (ref 3.5–5.1)

## 2013-06-05 LAB — MAGNESIUM: MAGNESIUM: 2.1 mg/dL

## 2013-06-05 LAB — PHOSPHORUS: Phosphorus: 2.6 mg/dL (ref 2.5–4.9)

## 2013-06-06 LAB — PHOSPHORUS: Phosphorus: 2.2 mg/dL — ABNORMAL LOW (ref 2.5–4.9)

## 2013-06-06 LAB — CALCIUM: CALCIUM: 8.9 mg/dL (ref 8.5–10.1)

## 2013-06-06 LAB — MAGNESIUM: MAGNESIUM: 1.9 mg/dL

## 2013-06-06 LAB — SODIUM: Sodium: 140 mmol/L (ref 136–145)

## 2013-06-06 LAB — POTASSIUM: Potassium: 2.8 mmol/L — ABNORMAL LOW (ref 3.5–5.1)

## 2013-06-07 ENCOUNTER — Other Ambulatory Visit: Payer: Self-pay | Admitting: Family Medicine

## 2013-06-07 LAB — PRO B NATRIURETIC PEPTIDE: B-TYPE NATIURETIC PEPTID: 293 pg/mL — AB (ref 0–125)

## 2013-06-07 LAB — CBC WITH DIFFERENTIAL/PLATELET
Basophil #: 0 10*3/uL (ref 0.0–0.1)
Basophil %: 0.2 %
EOS PCT: 0.2 %
Eosinophil #: 0 10*3/uL (ref 0.0–0.7)
HCT: 36.5 % (ref 35.0–47.0)
HGB: 12.1 g/dL (ref 12.0–16.0)
Lymphocyte #: 0.1 10*3/uL — ABNORMAL LOW (ref 1.0–3.6)
Lymphocyte %: 0.9 %
MCH: 31.4 pg (ref 26.0–34.0)
MCHC: 33.1 g/dL (ref 32.0–36.0)
MCV: 95 fL (ref 80–100)
Monocyte #: 0.2 x10 3/mm (ref 0.2–0.9)
Monocyte %: 1.7 %
NEUTROS ABS: 12.6 10*3/uL — AB (ref 1.4–6.5)
Neutrophil %: 97 %
PLATELETS: 113 10*3/uL — AB (ref 150–440)
RBC: 3.85 10*6/uL (ref 3.80–5.20)
RDW: 16.3 % — ABNORMAL HIGH (ref 11.5–14.5)
WBC: 13 10*3/uL — ABNORMAL HIGH (ref 3.6–11.0)

## 2013-06-07 LAB — URINALYSIS, COMPLETE
Bilirubin,UR: NEGATIVE
Glucose,UR: >=500 mg/dL (ref 0–75)
Ketone: NEGATIVE
NITRITE: NEGATIVE
PH: 6 (ref 4.5–8.0)
RBC,UR: 558 /HPF (ref 0–5)
SPECIFIC GRAVITY: 1.02 (ref 1.003–1.030)
Squamous Epithelial: 1

## 2013-06-07 LAB — COMPREHENSIVE METABOLIC PANEL
ANION GAP: 5 — AB (ref 7–16)
Albumin: 2.5 g/dL — ABNORMAL LOW (ref 3.4–5.0)
Alkaline Phosphatase: 121 U/L — ABNORMAL HIGH
BUN: 23 mg/dL — ABNORMAL HIGH (ref 7–18)
Bilirubin,Total: 0.6 mg/dL (ref 0.2–1.0)
Calcium, Total: 8.2 mg/dL — ABNORMAL LOW (ref 8.5–10.1)
Chloride: 112 mmol/L — ABNORMAL HIGH (ref 98–107)
Co2: 35 mmol/L — ABNORMAL HIGH (ref 21–32)
Creatinine: 0.55 mg/dL — ABNORMAL LOW (ref 0.60–1.30)
EGFR (Non-African Amer.): >60
GLUCOSE: 185 mg/dL — AB (ref 65–99)
Osmolality: 310 (ref 275–301)
Potassium: 3.6 mmol/L (ref 3.5–5.1)
SGOT(AST): 14 U/L — ABNORMAL LOW (ref 15–37)
SGPT (ALT): 52 U/L (ref 12–78)
Sodium: 152 mmol/L — ABNORMAL HIGH (ref 136–145)
TOTAL PROTEIN: 5.3 g/dL — AB (ref 6.4–8.2)

## 2013-06-08 ENCOUNTER — Other Ambulatory Visit: Payer: Self-pay | Admitting: Family Medicine

## 2013-06-11 LAB — URINE CULTURE

## 2013-07-01 ENCOUNTER — Ambulatory Visit: Payer: Self-pay | Admitting: Internal Medicine

## 2013-07-01 DEATH — deceased

## 2014-03-24 IMAGING — CR DG HIP COMPLETE 2+V*L*
1 series · 2 of 2 positions shown · non-contrast
Comparison: none

REASON FOR EXAM: pain post fall
COMMENTS:

PROCEDURE:     DXR - DXR HIP LEFT COMPLETE  - March 05, 2013 [DATE]
RESULT:     There is lucency in the intratrochanteric region of the proximal
left femur consistent with a fracture without comminution or distraction at
this point. The included pelvis appears unremarkable.

[Series 1: t hip ap left · 0.14mm/px · 2 of 2 slices shown]
[im 1/2]
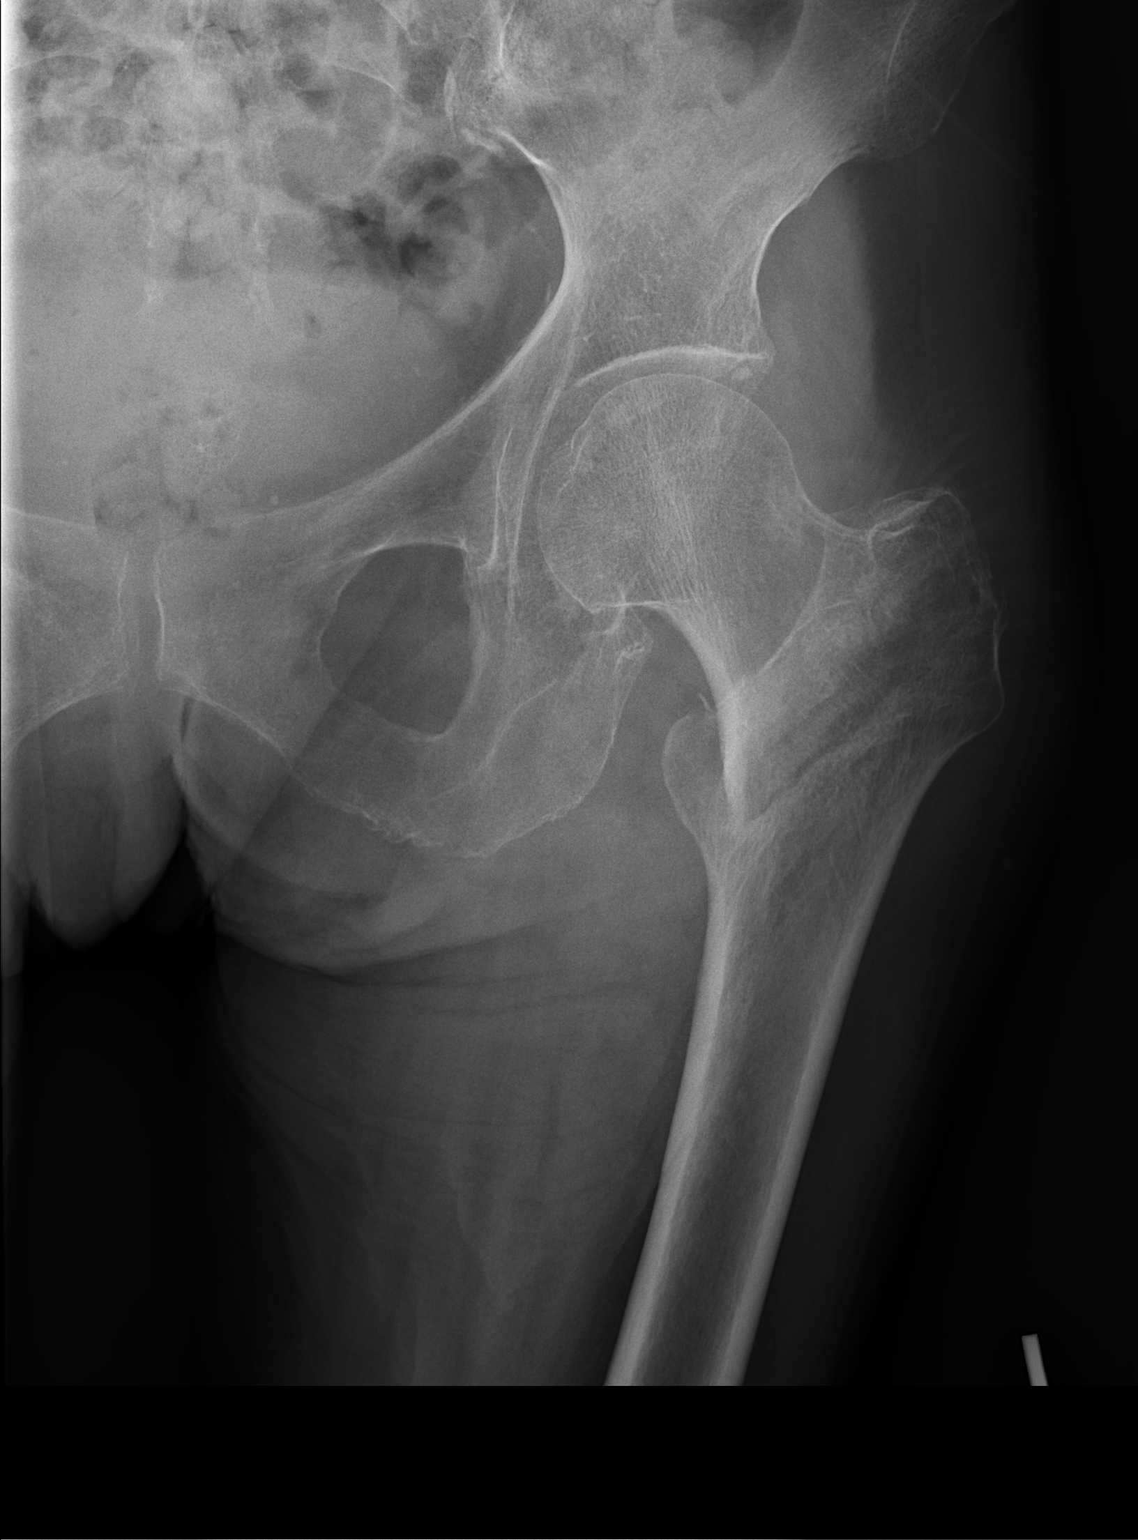
[im 2/2]
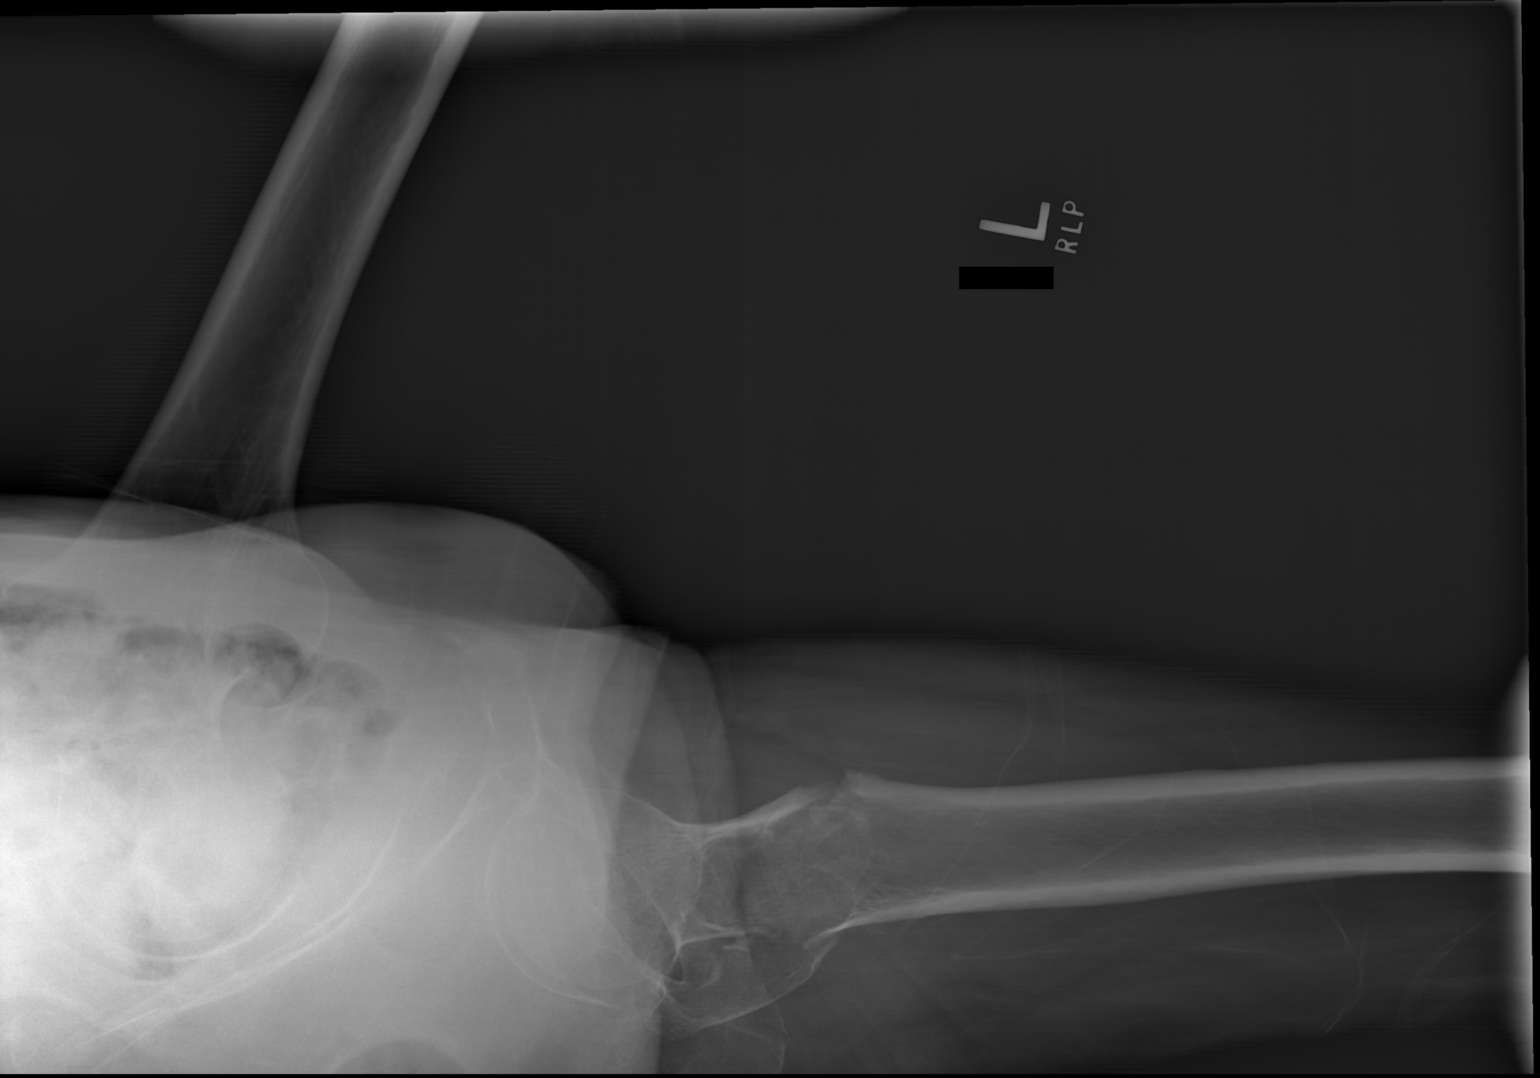

[2 of 2 positions shown; findings below may reference images not displayed]

IMPRESSION: 1. Fracture in the intratrochanteric region of the proximal left femur.

[REDACTED]

## 2014-03-26 IMAGING — CR DG CHEST 1V PORT
1 series · 1 of 1 positions shown · non-contrast
Comparison: none

REASON FOR EXAM: SOB
COMMENTS:

PROCEDURE:     DXR - DXR PORTABLE CHEST SINGLE VIEW  - March 07, 2013 [DATE]
RESULT:     Comparison: None

[ap]
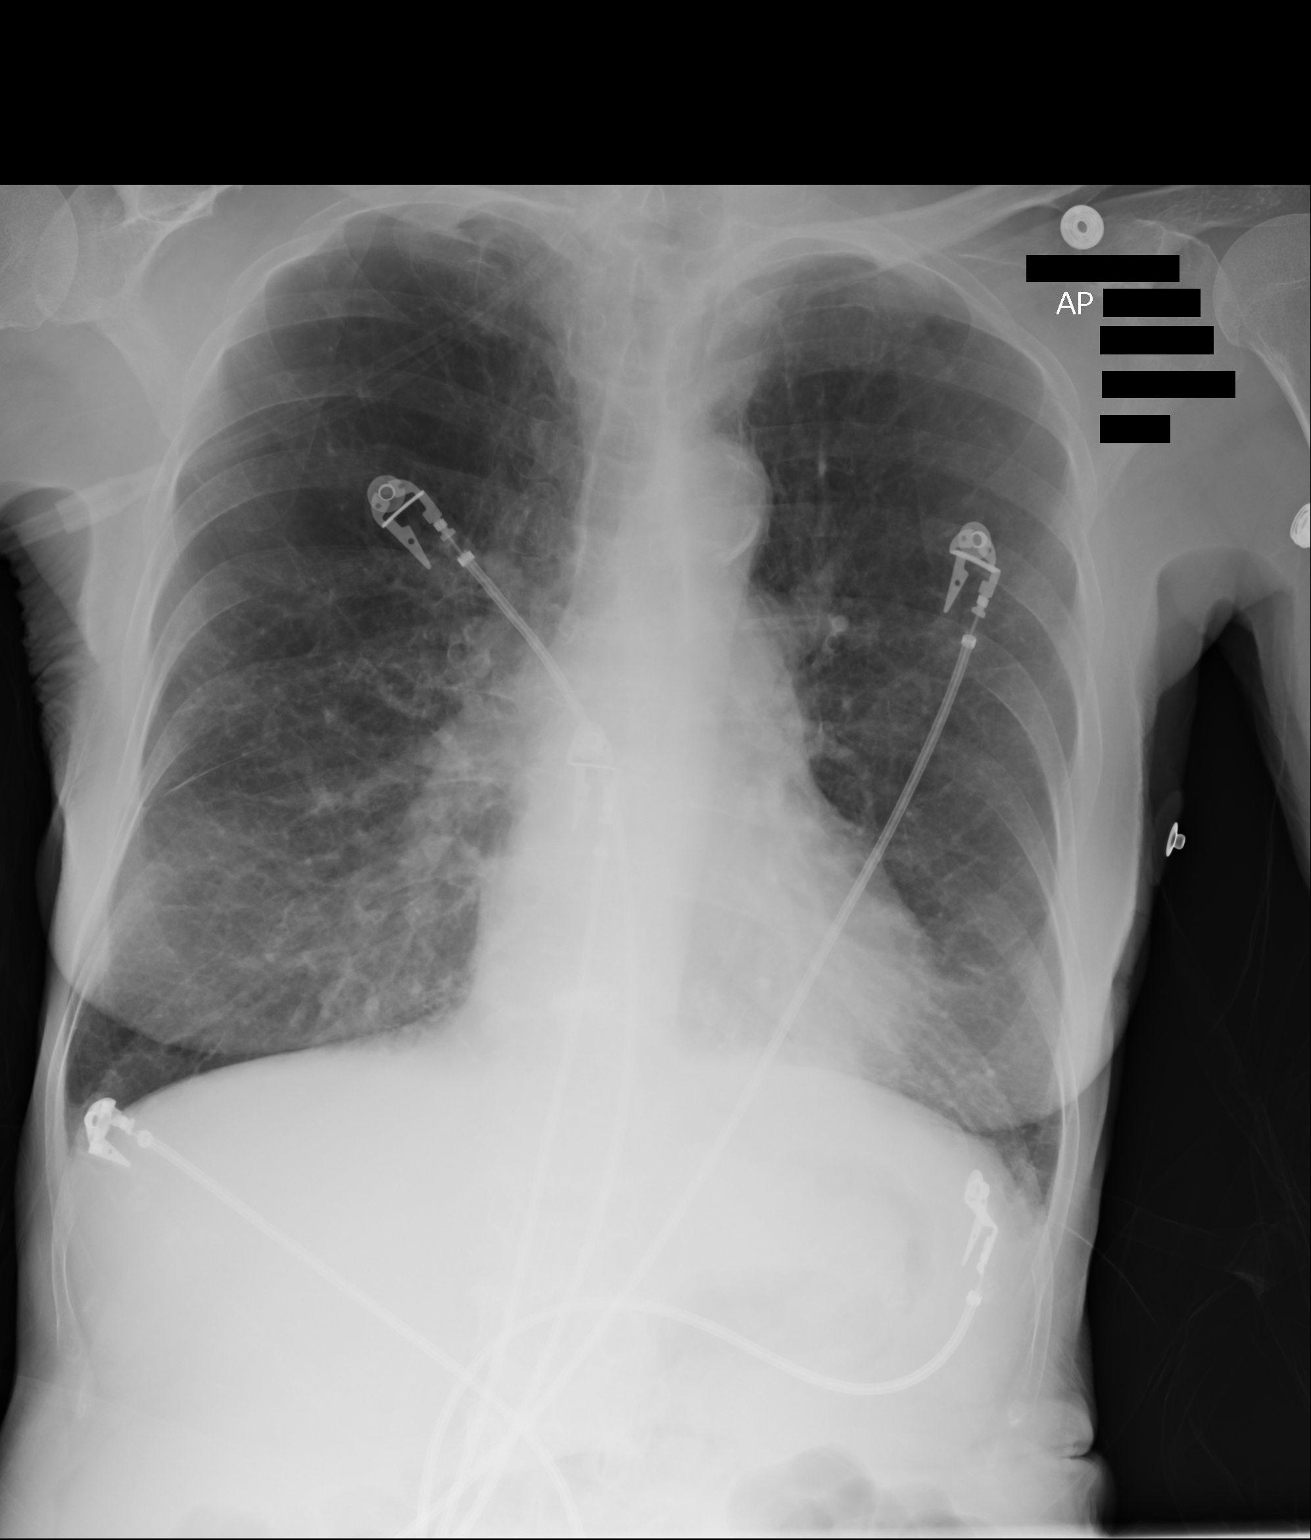

[1 of 1 positions shown; findings below may reference images not displayed]

FINDINGS: Single portable AP chest radiograph is provided. The lungs are hyperinflated
likely secondary to COPD. There is no focal parenchymal opacity, pleural
effusion, or pneumothorax. Normal cardiomediastinal silhouette. The osseous
structures are unremarkable.
IMPRESSION: No acute disease of the che[REDACTED]

## 2014-09-20 NOTE — H&P (Signed)
PATIENT NAME:  Melody RileyEACE, Jeanet MR#:  [id number] DATE OF BIRTH:  [birth year]  DATE OF ADMISSION:  09/08/2012  REFERRING PHYSICIAN:  Dr. Bayard Malesandolph Brown.  PRIMARY CARE PHYSIC[facility]  [facility]   CHIEF COMPLAINT:  Cough with productive sputum, shortness of breath.   HISTORY OF PRESENT ILLNESS:  This is a 69 year old female with history of COPD, still actively smoking, on 3 liters of home oxygen at home, presents with complaints of shortness of breath, the patient was severely hypoxic upon presentation where she required to be on BiPAP initially, where her respiratory status improved after that, where currently she is back on 3 liters nasal cannula where she tolerates that, the patient had chest x-ray which did show a right lung infiltrate as well, where she was started on IV Levaquin, the patient reports she is known to have history of COPD, does not have a primary pulmonary physician where she is followed, reports she was restarted before three weeks on a prednisone tapering dose by her PCP, but reports given the fact she has not been improving so she continued to take prednisone up to 30 mg by mouth daily for the last five days, as well her white count was found to be elevated at 20,000 which is most likely due to her prednisone use, the patient denies any chest pain, any recent travel, leg swelling, blood-tinged sputum, the patient was found to have sinus tachycardia at 140s to 150s, the patient reports this is her normal baseline where she runs usually at 140, where she has been told it is due to thyroid problem, she thinks it is hyperactive thyroid, where she reports she has been tried on different medications in the past where she could not tolerate where she has been off these meds for the last 6 months, and reports her primary care physician put her on metoprolol to control her heart rate where she reports she had poor response to that, hospitalist service were requested to admit the patient for  further treatment of her acute on chronic respiratory failure, COPD and pneumonia.   PAST MEDICAL HISTORY: 1.  History of COPD.  2.  Chronic respiratory failure, on 3 liters nasal cannula at home.  3.  Fibromyalgia.  4.  Depression.  5.  Tobacco abuse.  6.  Hypertension.  7.  Thyroid problem.  She cannot state exactly which problem, but it seems to be hyperthyroidism.   PAST SURGICAL HISTORY: 1.  Hysterectomy.  2.  Appendectomy.   SOCIAL HISTORY:  The patient is actively smoking 1/2 pack a day for the last three years, prior to that 1 pack per day for 40 years.  Occasional alcohol.  No illicit drug use.   FAMILY HISTORY:  No history of cardiac disease.   ALLERGIES:  MORPHINE AND ADVAIR DISKUS.   HOME MEDICATIONS: 1.  Prednisone tapering dose.  2.  Norco 325/10 mg as needed for pain.  3.  Effexor 75 mg extended release oral daily.  4.  Trazodone 300 mg at bedtime.  5.  Metoprolol 25 mg by mouth twice daily.  6.  Albuterol as needed.  7.  Spiriva 18 mcg inhalation daily.  8.  Symbicort 80/4.5 mg inhalation 2 puffs twice daily.  9.  Reports taking Lasix low dose, cannot recall.  10.  Another blood pressure medication which she cannot recall the name or the dose.   REVIEW OF SYSTEMS: CONSTITUTIONAL:  The patient denies fever, chills.  Complains of fatigue, weakness.  Denies any weight gain  or weight loss.  EYES:  Denies blurry vision, double vision, pain, inflammation.  EARS, NOSE, THROAT:  Denies tinnitus, ear pain, hearing loss, epistaxis or discharge.  RESPIRATORY:  Complains of cough with productive yellow sputum and wheezing.  Denies any hemoptysis, known history of COPD.  CARDIOVASCULAR:  Denies chest pain, orthopnea, edema, arrhythmia, palpitations, syncope.  GASTROINTESTINAL:  Denies nausea, vomiting, diarrhea, abdominal pain, hematemesis, melena, rectal bleed, constipation, diarrhea or hemorrhoids.  GENITOURINARY:  Denies dysuria, hematuria, renal colic.  ENDOCRINE:   Denies polyuria, polydipsia, heat or cold intolerance.  Reports having some thyroid problem, seems to be hyperthyroidism and report as well having hard time gaining weight.  HEMATOLOGY:  Denies anemia, easy bruising, bleeding diathesis.  INTEGUMENTARY:  Denies acne, rash or skin lesions.  MUSCULOSKELETAL:  Has history of fibromyalgia.  Denies any gout or cramps.  NEUROLOGIC:  Denies CVA, TIA, seizures, memory loss, dementia, ataxia, dysarthria.  PSYCHIATRIC:  Known history of depression.  Denies any schizophrenia, substance or alcohol abuse.   PHYSICAL EXAMINATION: VITAL SIGNS:  respiratory rate 24, blood pressure 127/56, saturating 91% on oxygen.  GENERAL:  Frail, elderly female who looks comfortable in bed in no apparent distress.  HEENT:  Head atraumatic, normocephalic.  Pupils equal, reactive to light.  Pink conjunctivae.  Anicteric sclerae.  Moist oral mucosa.  NECK:  Supple.  No thyromegaly.  No JVD.  CHEST:  Fair air entry bilaterally with diffuse wheezing and coarse respiratory entry with right lung rales.  CARDIOVASCULAR:  S1, S2 heard.  No rubs, murmur, gallops.  Tachycardic, regular rhythm.  ABDOMEN:  Soft, nontender, nondistended.  Bowel sounds present.  EXTREMITIES:  No edema.  No clubbing.  No cyanosis.  PSYCHIATRIC:  Appropriate affect.  Awake, alert x 3.  Intact judgment and insight.  SKIN:  Normal skin turgor.  Warm and dry.  No rash.   NEUROLOGIC:  Cranial nerves grossly intact.  Motor 5 out of 5.  PSYCHIATRIC:  Appropriate affect.  Awake, alert x 3.  Intact judgment and insight.   PERTINENT LABORATORY DATA:  Glucose 167, BUN 14, creatinine 0.84, sodium 140, potassium 3.8, chloride 98.  White blood cells 20.8, hemoglobin 14.8, hematocrit 44.9, platelets 388.  ABG showing pH 7.39, pCO2 54, pO2 43, HCO3 32.7.  Chest x-ray showing right mid and lower lung opacity, no volume overload, no cardiomegaly.   ASSESSMENT AND PLAN: 1.  Acute on chronic respiratory failure, the patient  was on 3 liters nasal cannula at home, has worsening respiratory failure, combined hypercarbic and hypoxic, which appears to be due to chronic obstructive pulmonary disease exacerbation and pneumonia.  Currently she is off BiPAP, tolerating nasal cannula.  2.  Chronic obstructive pulmonary disease exacerbation.  We will start the patient on IV Solu-Medrol and nebs, we will continue her on Symbicort and Spiriva and on 3 liters nasal cannula.  3.  Pneumonia.  We will continue patient on levofloxacin.  We will check sputum culture.  4.  Sinus tachycardia, appears to be chronic as per patient.  This is due to most likely hyperthyroidism where she has been started on metoprolol for that, but given the fact patient is hypoxic, complaining of shortness of breath, we will check CT chest with IV contrast to rule out pulmonary embolus.  5.  Thyroid problems, most likely due to hyperthyroidism.  We will check patient's TSH and Free T4.  We will try to obtain patient's records from her PCP office.  6.  Hypertension.  Currently acceptable.  We will continue  her on metoprolol and we will leave her on the other medications once it is known after contacting her pharmacy in the morning.  7.  Tobacco abuse.  The patient was counseled at length, and we will start her on NicoDerm patch.  8.  Depression.  Continue her on Effexor.  9.  Deep vein thrombosis prophylaxis.  SubQ heparin.  10.  Gastrointestinal prophylaxis, on Protonix, especially she is on large dose steroids.  11.  Leukocytosis due to steroids.  The patient is afebrile.  12.  CODE STATUS:  FULL CODE.   TOTAL TIME SPENT ON ADMISSION AND PATIENT CARE:  60 minutes.    ____________________________ Starleen Arms, MD dse:ea D: 09/08/2012 03:20:35 ET T: 09/08/2012 06:02:07 ET JOB#: [id number]  cc: Starleen Arms, MD, <Dictator> Sadae Arrazola Teena Irani MD ELECTRONICALLY SIGNED 09/08/2012 7:19

## 2014-09-20 NOTE — Discharge Summary (Signed)
PATIENT NAME:  Melody Hart, Melody Hart MR#:  [id number] DATE OF BIRTH:  10-May-1946  DATE OF ADMISSION:  09/08/2012 DATE OF DISCHARGE:  09/09/2012  For a detailed note, please take a look at the history and physical done on admission by Dr. Randol KernElgergawy.   DIAGNOSES AT DISCHARGE:  Are as follows:  1.  Acute on chronic respiratory failure secondary to chronic obstructive pulmonary disease exacerbation.  2.  Chronic obstructive pulmonary disease exacerbation.  3.  Hypertension.  4.  Depression.  5.  Gastroesophageal reflux disease.  DIET:  The patient is being discharged on a low-sodium diet.   ACTIVITY:  As tolerated.   FOLLOW-UP:  [facility].    DISCHARGE MEDICATIONS:  Premarin 1.25 mg daily, albuterol inhaler 2 puffs q. 6 hours as needed, trazodone 100 mg 3 tabs at bedtime, Effexor 75 mg daily, Spiriva 1 puff daily, metoprolol tartrate 25 mg by mouth daily, Norco 10/325 1 tab twice daily as needed, nicotine patch daily 21 mg, Symbicort 80/4.5 2 puffs twice daily, prednisone taper starting at 50 mg, down to 10 mg over the next 5 days and Levaquin 500 mg daily x 5 days.   PERTINENT STUDIES DONE DURING THE HOSPITAL COURSE:  Are as follows:  A chest x-ray done on admission showing no acute cardiopulmonary disease of the chest and a CT scan of the chest done with contrast showing no evidence of pulmonary embolism, COPD with mild basilar pneumonia.   HOSPITAL COURSE:  This is a 69 year old female with medical problems as mentioned above, presented to the hospital with shortness of breath, cough, congestion, noted to be in COPD exacerbation.   1.  Acute on chronic respiratory failure.  Most likely the cause of this is the patient's COPD.  The patient has underlying chronic COPD on oxygen at home.  She was admitted with COPD exacerbation, started on IV steroids, around-the-clock nebulizer treatments, empiric antibiotics and also maintained on her Spiriva.  She was not  on an inhaled corticosteroid therefore Symbicort was added to her therapy regimen.  The patient's clinical symptoms have significantly improved since admission.  She currently is back down to her baseline oxygen and feels much better, therefore being discharged home on a prednisone taper and Levaquin as mentioned.  As mentioned she is being started on Symbicort as an inhaled corticosteroid as she was not on one on prior to coming in.  2.  COPD exacerbation, likely the cause of patient's acute on chronic respiratory failure.  This has clinically improved since admission with aggressive therapy with IV steroids, around-the-clock DuoNebs, empiric antibiotics and started on some inhaled corticosteroids.  Presently, patient is being discharged on some Symbicort, some Spiriva along with a prednisone taper and empiric Levaquin as stated.  She is already on home oxygen and she will continue that.  3.  Hypertension.  The patient remained hemodynamically stable in the hospital.  She will continue on metoprolol as stated.  4.  Depression.  The patient will continue with Effexor and trazodone which is what she was maintained on. 5.  GERD.  The patient was maintained on omeprazole and she will resume that upon discharge too.   CODE STATUS:  THE PATIENT IS A FULL CODE.   TIME SPENT:  Is 35 minutes.    ____________________________ Rolly PancakeVivek J. Cherlynn KaiserSainani, MD vjs:ea D: 09/09/2012 15:40:13 ET T: 09/10/2012 06:03:46 ET JOB#: [id number]  cc: Rolly PancakeVivek J. Cherlynn KaiserSainani, MD, <Dictator> [facility] [place] SirenVIVEK J SAINANI MD ELECTRONICALLY SIGNED  09/14/2012 21:32 

## 2014-09-20 NOTE — Op Note (Signed)
PATIENT NAME:  Polo RileyEACE, Miriya MR#:  [id number] DATE OF BIRTH:  09-02-1945  DATE OF PROCEDURE:  03/09/2013  PREOPERATIVE DIAGNOSIS: Left hip intertrochanteric fracture.   POSTOPERATIVE DIAGNOSIS:  Left hip intertrochanteric fracture.   PROCEDURE PERFORMED: Intramedullary nail fixation of left hip intertrochanteric fracture.   SURGEON: Murlean HarkShalini Cassady Stanczak, M.D.   ASSISTANT: None.   ESTIMATED BLOOD LOSS: 125 mL.   ANESTHESIA: Spinal anesthesia.   IMPLANTS: Biomet Affixus hip fracture nail, with lag screw and cortical bone screw.   COMPLICATIONS: No immediate intraoperative or postoperative complications noted.   DISPOSITION: The patient will be weightbearing as tolerated. She will placed on Lovenox for DVT prophylaxis. She will have pain control provided.   INDICATIONS FOR PROCEDURE: Ms Fratto Vito Bergerieces is a 69 year old female who had fallen at home. She has been using a walker for ambulation. She had she had a kyphoplasty of the lumbar spine approximately 3 weeks ago. She had a drop-foot at baseline on the left side. She presented to the Emergency Room with an intertrochanteric fracture of the left hip.   An extensive discussion was had with the patient regarding surgical intervention. The patient decide to proceed with surgery.   DESCRIPTION OF PROCEDURE: Ms Ballinas was identified in the preoperative holding area. The left lower extremity was marked as the operative site. The risks and benefits of surgery were again explained to the patient and all questions were answered.   The patient was brought into the Operating Room. Spinal anesthesia was administered. The patient was placed on the fracture table in the supine position. She was secured on the table and  bony prominences were padded. The right lower extremity was placed in an appropriately-padded well-leg holder and secured. Left lower extremity was placed in traction. Under fluoroscopic guidance, the fracture was held in reduction. Left  lower extremity was prepared and draped in the usual sterile fashion. Surgical time-out was performed, identifying the patient, procedure, laterality, confirming antibiotics, confirming imaging, confirming consent form.   Under fluoroscopic guidance, the tip of the greater trochanter was palpated. Just proximal to the trochanter longitudinal to the tip, a longitudinal incision was made. Sharp dissection was carried down to the fascia. Blunt dissection was carried down to the muscle fibers. Under fluoroscopic guidance a pin was inserted into the trochanter. It was taken just past the lesser trochanter. Confirmation of placement of the pin was done on orthogonal imaging. A starting reamer was used to open the proximal cortex. At this time a ball-tipped guidewire was inserted and confirmation of placement of the wire was done under fluoroscopic images at the hip and the knee. Guidewire was measured. Measurement came out to be 350 mm, and therefore a 340 mm nail was chosen.   Sequential reaming was carried starting at 9 mm, progressing to 13 mm. An 11 mm x 340 mm nail, with a 130-degree hip screw opening was inserted. Placement of the nail was confirmed at the knee and the hip on imaging. A ball-tipped guidewire was removed and lag screw insertion guide was inserted. A small lateral incision was made in-line with the insertion guide. Sharp dissection was carried down through the fascia, and blunt dissection through the muscle down to bone. The insertion guide was taken through the fascia all the way down to the bone. Using the provided guide, a guidepin was inserted into the femoral neck. Appropriate placement of the guidepin was confirmed on orthogonal radiographs of the hip. The guidepin was measured to be 95 mm. Guidepin was over-drilled, and  a 90 mm hip fracture lag screw measuring 10.5 mm x  90 mm was inserted. Care was taken to ensure that the femoral head component did not rotate during insertion of the  nail. Guidepin was removed.   Attention was now turned to the knee. Using a standard perfect-circle technique, one distal cortical screw, locking, was inserted. We measured 5 mm x 36 mm. Final radiographs were obtained at both the hip and the knee. Prior to insertion of distal interlocking screw traction was released and under fluoroscopic guidance 4 mm of compression was dialed in, and the lag screw was then locked. After locking the lag screw it was loosened by one-quarter turn.   All wounds were copiously irrigated. Deep fascia was closed using 0 Vicryl suture. Subcutaneous tissue was closed using 2-0 Vicryl suture. Skin was closed using staples.   DISPOSITION: The patient will be weightbearing as tolerated. She will have Lovenox for DVT prophylaxis. She will have 24 hours of antibiotics.     ____________________________ Murlean Hark, MD sr:dm D: 03/09/2013 15:22:59 ET T: 03/09/2013 17:41:32 ET JOB#: [id number]  cc: Murlean Hark, MD, <Dictator> Murlean Hark MD ELECTRONICALLY SIGNED 04/05/2013 12:39

## 2014-09-20 NOTE — Consult Note (Signed)
Brief Consult Note: Diagnosis: Left hip basicervical/IT fracture after fall.   Patient was seen by consultant.   Comments: Patient has been using a walker at home after kyphoplasty.  Had a syncopal episode and fell, sustaining aforementioned fracture. Has relative foot drop on left that she states pre-dates this injury.  She lives independently.    PE: Very thin. In moderate distress.  Left lower extremity - plantar flexion intact, no active dorsiflexion.  DPN and SPN sensation altered. Tib nerve intact to light touch.  Hip TTP.  Minimal muscle mass. No skin abrasions.  Imaging: Left hip basicervical/IT fracture  Assessment: As above.  Plan: Discussed in detail with patient proceeding with IM nail.  Advised her of risks of surgery.  Thin body habitus and poor nutrition places her at higher risk of non-union, wound complication and symptomatic hardware.  She understands these risks. Would like to proceed.  Discussed risks associated with smoking in relation to non-union and infection.  Patient continues to smoke 1/2 ppd.  She has baseline anemia.  Type and screen ordered - will likely require a transfusion post-op.  NPO after midnight.  Plan for surgery when cleared by medicine.  Currently on OR schedule for tomorrow morning at 7:30am..  Electronic Signatures: Murlean Harkamasunder, Verdun Rackley (MD)  (Signed 06-Oct-14 17:17)  Authored: Brief Consult Note   Last Updated: 06-Oct-14 17:17 by Murlean Harkamasunder, Nizar Cutler (MD)

## 2014-09-20 NOTE — Op Note (Signed)
PATIENT NAME:  Melody Hart, Melody Hart MR#:  [id number] DATE OF BIRTH:  12/02/1945  DATE OF PROCEDURE:  02/16/2013  PREOPERATIVE DIAGNOSIS:  L5 and T11 compression fractures.   POSTOPERATIVE DIAGNOSIS:  L5 and T11 compression fractures.  PROCEDURE:  L5 and T11 kyphoplasty and biopsy.   ANESTHESIA:  MAC.   SURGEON:  Leitha SchullerMichael J. Genna Casimir, M.D.   DESCRIPTION OF PROCEDURE:  The patient was brought to the Operating Room and after adequate anesthesia was obtained, the patient was placed prone.  C-arm was brought in and good visualization of both vertebral bodies could be obtained.  The timeout procedure was completed along with patient identification, procedure and the skin was prepped with alcohol and 5 mL of 1% Xylocaine was infiltrated each area for potential trocar placement.  The back was then prepped and draped in the usual sterile fashion and spinal needle was used to get local down to the pedicle and through the soft tissue tract on both sides using a combination of Xylocaine and 0.5% Sensorcaine with epinephrine.  Following this, a stab incision was made on the right at L5, trocar advanced and making certain that it was going through the pedicle and not into the spinal canal.  Good visualization was obtained and appropriate trocar placement was obtained.  Biopsy was taken at this point, followed by drilling.  The identical procedure carried out on the left with again bone biopsy obtained and then drilling.  Both sides were inflated up to 5 mL.  The right side was let down and that side was then filled with bone cement and allowed to partially set as we had a partial correction of a severe deformity of the L5 vertebral body.  After the right side had partially been filled, the left side balloon was deflated and that side was also filled with approximately 4.5 mL of bone cement into each side.  This appeared to reconstitute most of the height of the vertebral body.  The trocars were removed and permanent C-arm  views obtained.  Attention was then brought up to the T11 where a right-sided entry was made transpedicular into the vertebral body and this single side, all that was necessary is it got into the center of a very flat vertebral body.  Drilling was carried out, followed by balloon inflation with fairly high pressure showing this to be more of a subacute or chronic compression fracture.  Bone cement was injected with approximately 1.5 mL with good fill of the vertebral body.  The incisions were closed with Dermabond and Band-Aids.   ESTIMATED BLOOD LOSS:  Minimal.   COMPLICATIONS:  None.   SPECIMEN:  T11 and L5 vertebral body biopsies.   CONDITION:  To recovery room, stable.     ____________________________ Leitha SchullerMichael J. Briunna Leicht, MD mjm:ea D: 02/16/2013 23:46:22 ET T: 02/17/2013 02:47:24 ET JOB#: [id number]  cc: Leitha SchullerMichael J. Jamorian Dimaria, MD, <Dictator> Leitha SchullerMICHAEL J Lannie Yusuf MD ELECTRONICALLY SIGNED 02/17/2013 8:44

## 2014-09-20 NOTE — Discharge Summary (Signed)
PATIENT NAME:  Melody Hart, Melody Hart MR#:  [id number] DATE OF BIRTH:  1945/06/16  ADMITTING DIAGNOSES: Syncope and left hip fracture.   DISCHARGE DIAGNOSES:  1.  Syncope. 2.  Fall. 3.  Dehydration.  4.  Intratrochanteric left hip fracture, status post nailing and pinning on 06 March 2013, by Dr. Ernest Pine.  5.  Cardiomyopathy with ejection fraction of 45%.  6.  Hypokalemia.  7.  Hypomagnesemia.  8.  Acute posthemorrhagic anemia postoperative, status post 1 unit of packed red blood cells transfusion.  9.  Chronic obstructive pulmonary disease.  10.  Chronic respiratory failure, on 3 liters of oxygen through nasal cannula.  11.  Tobacco abuse.  12.  Ongoing depression.  13.  Hypertension.  14.  Hypothyroidism.  15.  Gastroesophageal reflux disease.  16.  Vertebral compression fracture, status post kyphoplasty.  17.  History of anemia.  18.  Panic attacks.  19.  Hysterectomy.  20.  Appendectomy.  21.  Osteoporosis.  22.  Chronic back pain.  23.  Insomnia.  24.  Sinus tachycardia with exertion after procedure.   DISCHARGE CONDITION: Stable.   DISCHARGE MEDICATIONS:  The patient is to resume her outpatient medications which are:  1.  Prednisone 10 mg p.o. daily.  2.  Levothyroxine 50 mcg p.o. daily.  3.  ProAir HFA 2 puffs every 2 hours as needed.  4.  DuoNebs 2.5/0.5 mg in 3 mL inhalation solution 3 mL every 6 hours as needed.  5.  Tylenol 325 mg 2 tablets every 4 hours as needed.  6.  Norco 10/325 mg 1 tablet every 3 hours as needed.  7.  Trazodone 100 mg p.o. at bedtime.  8.  Venlafaxine 75 mg p.o. at bedtime.  9.  Lorazepam 0.5 mg 3 times daily as needed.  10.  Pantoprazole 40 mg p.o. daily.  11.  Oxycodone 5 mg every 6 hours as needed.  12.  Atrovent HFA 2 puffs once daily.  13.  Symbicort 80/4.5 two puffs once daily.  14.  Oxycodone 5 mg 1 tablet every 4 hours as needed.  15.  Enoxaparin 40 mg subcutaneously once a day for 14 more days and then begin 81 mg aspirin therapy  daily.  16.  Iron sulfate 325 mg 1 tablet twice daily.  17.  Norflex 100 mg twice daily.  18.  Nicotine transdermal patch 21 mg topically daily.  19.  Metoprolol 50 mg p.o. twice daily.   DRESSING CARE: Staples in place to close the incision. Area clean and dry. Keep bandage in place unless it becomes loose or soiled.   HOME OXYGEN: Four liters of oxygen through nasal cannula at rest as well as on exertion.   DIET: Low salt, low fat, low cholesterol, regular consistency.   ACTIVITY LIMITATIONS: As tolerated.   FOLLOW UP APPOINTMENT: With Dr. Ernest Pine in 2 weeks after discharge, Dr. Leta Baptist in 2 weeks after discharge.   CONSULTANTS: Dr. Ernest Pine, Dr. Claris Gladden, care management, social work, Dedra Skeens, [place].   RADIOLOGIC STUDIES:  Chest, portable single view, 05 March 2013, showed COPD, prominence of interstitial markings. Correlate clinically. Osteoporosis is noted; hence the vertebral body compression fracture on 6 October, 2014.   Lumbar spine, AP and lateral, 05 March 2013, revealed osteoporosis, slight loss of height in L4 appears to have occurred since the previous study. MRI follow up may be beneficial to assess for compression fracture interval vertebroplasty of L5.   Left hip complete x-ray, 05 March 2013, showed fracture of intertrochanteric region of proximal  left femur.   Carotid ultrasound, 05 March 2013, due to syncope, revealed atherosclerotic disease without evidence of hemodynamically significant stenosis.   Echocardiogram, 06 March 2013, revealed left ventricular ejection fraction by visual estimation 45% to 50%. Mildly decreased global left ventricular systolic function, moderately dilated left atrium as well as right atrium, moderate to severe mitral valve regurgitation, moderate to severe tricuspid regurgitation, moderately elevated pulmonary arterial systolic pressure, mildly increased left ventricular posterior wall thickness.   Left hip x-ray after procedure, 06 March 2013, revealed ORIF of the proximal left femur fracture. Hardware appeared to be normally located. Anatomic alignment has been restored. No complication of the C-arm images according to radiology.   Femur, left, x-ray, 06 March 2013, revealed anatomic alignment of the intertrochanteric left proximal femur fracture status post ORIF without evidence of hardware or bony complication.   Portable chest x-ray, 07 March 2013, revealed no acute disease of the chest.   HISTORY OF PRESENT ILLNESS AND HOSPITAL COURSE: The patient is a 69 year old Caucasian female with past medical history significant for a history of osteoporosis, multiple medical problems including hypertension, COPD, chronic respiratory failure, oxygen dependent, ongoing tobacco abuse, who presents to the hospital after a fall and syncope. Please refer to Dr. Arlys John admission note on 05 March 2013.   On arrival to the hospital, the patient was noted to have pulse of 101, temperature of 98.6. Respiration rate was 20. Blood pressure 110/46. Saturation was 94% on oxygen therapy.   Physical exam revealed a shortened as well as laterally rotated left lower extremity.   LABORATORIES: The patient's lab data done on admission, 05 March 2013, showed glucose of 139, potassium of 2.9, magnesium 1.3, albumin level of 2.8. Total protein was 5.7. Alkaline phosphatase 174. Otherwise comprehensive metabolic panel was within normal limits.   Cardiac enzymes x3 were within normal limits.   White blood cell count was mildly elevated at 11.2. Hemoglobin was 11.2. Platelet count 188.   Coagulation panel was normal. Urinalysis was remarkable for 7 red blood cells, 1 white blood cell. No bacteria were noted. Negative for leukocyte esterase.   EKG showed sinus tachy at 112 beats per minute and nonspecific T-wave abnormality. No significant change since prior EKG done in September 2014.   The patient was admitted to the hospital for further  evaluation. Her potassium was supplemented and consultation with Dr. Claris Gladden was obtained. Dr. Claris Gladden saw the patient for observation on the same day,  05 March 2013, and she felt that it was intertrochanteric fracture after syncope episode. Recommended to proceed with IM nail.   The risks of surgery were discussed. The patient was felt to be ready to go to the operating room on 06 March 2013. Her potassium level as well as magnesium level normalized at 3.6 as well as  2.0, respectively. The patient underwent nailing and pinning of the left intertrochanteric hip with improvement of her condition.   Postoperatively, she relatively well. She was evaluated by physical therapist who recommended rehabilitation placement. The patient required, however, lots of pain medications to control her discomfort. She is to continue pain medications as needed and follow up with orthopedic surgeon, Dr. Ernest Pine, in the next few weeks after discharge.   In regards to her chronic medical problems including COPD, chronic respiratory failure, postoperatively the patient was wheezing and required increasing doses of DuoNebs. She remained tachycardia with DuoNebs; however, her O2 saturations were satisfactory, and even though initially she required 4 liters of oxygen, later on we  were able to wean her down to 3 and 3.5 liters with oxygen saturations of 91% to 93% on 3.5 liters of oxygen. It is recommended to continue oxygen therapy as well as nebulizer therapy and follow up her condition, weaning her O2 to her baseline of 3 liters.   Chest x-ray as mentioned above did not show any pneumonia. The patient is to continue steroids as well.   For tobacco abuse, the patient was advised to quit and nicotine replacement therapy was initiated.   For her chronic medical problems such as hypertension, hypothyroidism, gastroesophageal reflux disease, the patient is to continue her outpatient management.   DISPOSITION: The patient  is being discharged to a rehabilitation facility in relatively stable condition with the above-mentioned medications and follow-up.   DISCHARGE VITAL SIGNS: Temperature was 98. Heart rate was ranging from 104 to 117. Respiration rate was 22. Blood pressure ranging from 158 to 166 systolic and 60s to 70s diastolic. Oxygen saturation was 91% to 93% on 3.5 to 4 liters of oxygen through nasal cannula.   TIME SPENT:    ____________________________ Katharina Caperima Nelva Hauk, MD rv:np D: 03/09/2013 14:48:51 ET T: 03/09/2013 15:48:19 ET JOB#: [id number]  cc: Katharina Caperima Esaiah Wanless, MD, <Dictator> Thea Alkenhomas C. Keyserling, MD  Mystique Bjelland MD ELECTRONICALLY SIGNED 03/13/2013 12:34

## 2014-09-20 NOTE — H&P (Signed)
PATIENT NAME:  Melody Hart, Melody Hart MR#:  [id number] DATE OF BIRTH:  Oct 26, 1945  DATE OF ADMISSION:  05/19/2013  REFERRING PHYSICIAN: Bayard Males, MD    PRIMARY CARE PHYSICIAN: Dr. Leta Baptist at [facility].   CHIEF COMPLAINT: Shortness of breath.   HISTORY OF PRESENT ILLNESS: This is a 70 year old female with known history of chronic obstructive pulmonary disease, on 3 liters nasal cannula, with history of intubation in the past, hypertension, low back pain, still ongoing smoking and hypothyroidism, presents with shortness of breath. The patient called EMS, complaining of shortness of breath. Upon presentation, she was in severe respiratory distress, where she required intubation. As well, the patient was noticed to be in supraventricular tachycardia, heart rate in the 180s. She given adenosine x2 as well IV Cardizem with no improvement, but SVT did convert to sinus tachycardia after the patient was intubated.   Unfortunately there are no family members at bedside. As well, we tried to contact family members on records, but there is no correct phone number. The patient's chest x-ray did not show any acute opacity or infiltrate. The patient was afebrile. She had only mild leukocytosis at 13,000. It is unclear what is her home medication or if she was taking any prednisone or not, but the patient was recently in the ED for complaints of shortness of breath, where she was prescribed steroids and antibiotics, but prescriptions were still found to be in her purse by ED staff, which were not dispensed. The patient's labs were significant for hypokalemia at 2.8. Initially blood pressure was within normal range at 114/64, but it did drop at one points when the patient was started on propofol, but it did respond to fluid bolus. Hospitalist service was requested to admit the patient for her acute on chronic respiratory failure.   PAST MEDICAL HISTORY: 1.  Chronic respiratory disease, chronic obstructive pulmonary  disease, on 3 liters nasal cannula.  2.  Chronic lower back pain.  3.  Hypertension.  4.  Hypothyroidism.  5.  Depression.  6.  Insomnia.  7.  Ongoing tobacco use.    PAST SURGICAL HISTORY: 1.  Hysterectomy.  2.  Appendectomy.  3.  Recent hospitalization with hip fractures, status post nailing/pinning by Dr. Ernest Pine on  03/06/2013.   ALLERGIES: MORPHINE AND ADVAIR.   HOME MEDICATIONS: Currently the patient is unclear what kind of medication she is taking. She has no medication list with her upon her presentation.   SOCIAL HISTORY: The patient lives by herself. Smokes 1/2 pack per day. No alcohol. No drug use.   FAMILY HISTORY: Mother had chronic obstructive pulmonary disease.  REVIEW OF SYSTEMS: Unable to obtain as the patient is sedated and intubated.   PHYSICAL EXAMINATION: VITAL SIGNS: Blood pressure 117/51, pulse 108, respiratory rate 20, temperature 98, saturating 100% on vent settings, PEEP of 57, tidal volume 400, FiO2 of 45%, respiratory rate of 14.  GENERAL: Frail, chronically ill-appearing female, lies in bed, intubated, sedated.  HEENT: Head normocephalic, normocephalic. Pupils equal, reactive to light. Pale conjunctivae. Anicteric sclerae. Intubated.  NECK: Supple. No thyromegaly. No JVD.  CHEST: Scattered wheezing. Good air entry bilaterally.  CARDIOVASCULAR: S1, S2 heard. Mildly tachycardic. Regular.  ABDOMEN: Soft, nontender and nondistended. Bowel sounds present.  EXTREMITIES: No edema. No clubbing, no cyanosis. Dorsalis pedis +2 bilaterally. NEUROLOGIC: Unable to evaluate, as she is sedated, but she appears to be moving her extremities occasionally, without significant deficit. LYMPHATIC:  No cervical lymphadenopathy appreciated.  SKIN: Warm and dry.   PERTINENT LABORATORY DATA:  Glucose 160, BUN 11, creatinine 0.62, sodium 140, potassium 2.8, chloride 97, CO2 39. Anion gap 4. Troponin less than 0.02. White blood cell 15.2, hemoglobin 14.3, hematocrit 45.1,  platelets 329. ABG 7.38, pCO2 78, pO2 of 378, lactic acid 2.1.   IMAGING: Chest x-ray showing chronic obstructive pulmonary disease/emphysema,  endotracheal tube placement without other significant abnormality.  ASSESSMENT AND PLAN:  1.  Acute on chronic respiratory failure. This is due to chronic obstructive pulmonary disease exacerbation. The patient is currently intubated. Will consult pulmonary for vent management. We will repeat ABG in a.m. FiO2 already has been lowered after pO2 of above 312 showing on ABG.  2.  Chronic obstructive pulmonary disease exacerbation. The patient is currently intubated. We will continue her on large dose Solu-Medrol 125 every six hours. We will have her on Symbicort and DuoNebs as well. We will start her on antibiotics, including Rocephin and azithromycin. 3.  Supraventricular tachycardia. The patient will be on telemetry monitor. Currently resolved. We will consult cardiology, will check her troponins, will correct her hypokalemia. We will check magnesium level. This is most likely related to her severe respiratory distress.  4.  Hypokalemia. We will replace. We will check magnesium level as well.  5.  Hypertension. Will hold all her meds currently as her blood pressure is on the lower side.  6.  Hypothyroidism. We will continue with Synthroid via gastric tube.  7.  Deep vein thrombosis prophylaxis. Subcutaneous heparin.   CODE STATUS: At this point, could not get in touch with any family members. Will keep her as a full code, as her CODE STATUS during previous admissions.   TOTAL CRITICAL CARE TIME SPENT: 60 minutes.     ____________________________ Starleen Armsawood S. Ilana Prezioso, MD dse:cg D: 05/19/2013 02:47:18 ET T: 05/19/2013 04:58:55 ET JOB#: [id number]  cc: Starleen Armsawood S. Malaky Tetrault, MD, <Dictator> Wirt Hemmerich Teena IraniS Ahliya Glatt MD ELECTRONICALLY SIGNED 05/23/2013 5:46

## 2014-09-20 NOTE — H&P (Signed)
PATIENT NAME:  Melody Hart, Melody Hart MR#:  [id number] DATE OF BIRTH:  [birth year]  DATE OF ADMISSION:  03/05/2013  PRIMARY CARE PHYSICIAN:  [facility].  The patient is a 69 year old Caucasian female with past medical history significant for history of chronic respiratory failure, oxygen-dependent, COPD, ongoing tobacco abuse, history of  hypertension, hypothyroidism, osteoporosis with pathologic compression fractures status post recent kyphoplasty on the 02/16/2013, in L5, as well as T11 by Dr. Rosita KeaMenz, presents back to the hospital after a fall. According to the patient, she fell down yesterday, on the 5th of October 2014. She said that she just went to thermostat because she felt somewhat hot, as well as sleepy, and she does not remember tripping on anything, but just fell down. She denies any lightheadedness or dizziness or feeling presyncopal coming to thermostat. Denies any pains or palpitations in her chest. Denies any nausea or any other discomfort. She remembers falling down, and she remembers landing on the floor; however, she still thinks that she actually passed out. She was not able to stand up and walk. She was able to slither to the telephone, and she called her neighbor, who sat her down on the sofa. She spent her day on the sofa.  She was not able to eat much. She had no appetite, however, was able to drink some. She used pan for the bathroom. Today, however, in the morning, since her pain did not subside, she decided to call EMS. She complains of some bruising in the left side of her body including her left arm, as well as significant pain in the left hip area and left groin area. On arrival to the Emergency Room, she was noted to be dehydrated, somewhat relatively hypotensive with systolic blood pressures around 110. She was also noted to be hypokalemic on her labs, as well as hypomagnesemic. Her x-rays revealed left intratrochanteric hip fracture and hospitalist services were contacted for  admission. The patient complains of thirst, and she requests something to drink.   PAST MEDICAL HISTORY: Significant for multiple medical problems, also admission in September 2014 for acute respiratory failure due to COPD, history of hypokalemia, ileus status post colonoscopy in 2007 by Dr. Servando SnareWohl revealing diverticulosis of the sigmoid colon, as well as lipoma in the hepatic flexure, history of anemia, fibromyalgia, depression, panic attacks, history of chronic respiratory failure on 3 liters of oxygen through nasal cannula at home, history of ongoing tobacco abuse, hypertension, hypothyroidism, hysterectomy, appendectomy, gastroesophageal reflux disease, status post kyphoplasty on 02/16/2013. T11-L5 by Dr. Rosita KeaMenz, history of osteoporosis with physiologic compression fractures in multiple lumbar vertebrae,  including L2, L3, as well as L4, chronic back pain, as well as insomnia.   ALLERGIES: MORPHINE, AS WELL AS ADVAIR.   MEDICATIONS: According to medical records, the patient is on:  1.  Tylenol 325 mg 2 tablets every 4 hours as needed.  2.  Albuterol CFC free 2 puffs 4 times daily as needed.  3.  Atrovent 2 puffs 4 times daily.  4.  DuoNebs 2.5/0.5 in 3 mL inhalation solution 3 mL every 6 hours as needed.  5.  Flonase 2 sprays once daily.  6.  Furosemide 40 mg total, 2 tablets of 20 mg once daily.  7.  Levothyroxine 50 mcg p.o. daily.  8.  Lorazepam 0.5 mg 3 times daily as needed.  9.  Metoprolol tartrate 25 mg p.o. twice daily.  10.  Norco 10/325 mg 1 tablet every 3 hours as needed.  11.  Oxycodone 5  mg every 6 hours as needed.  12.  Pantoprazole 40 mg p.o. daily.  13.  Prednisone 10 mg daily.  14.  ProAir HFA 2 puffs twice daily.  15.  Symbicort 80/4.5, 2 puffs twice daily.  16.  Tiotropium 1 inhalation once daily.  17,  Trazodone 100 mg p.o. at bedtime.  18.  Venlafaxine 75 mg p.o. daily at bedtime.   SOCIAL HISTORY: Lives at home by herself. Continues to smoke approximately 1/2 pack a  day. Used to smoke 1 pack a day for a long period of time. Occasional alcohol use. No drug abuse.   FAMILY HISTORY: The patient's mother had COPD, died from failure to thrive. The patient does not know much about her father.   REVIEW OF SYSTEMS:  CONSTITUTIONAL:  Positive for fall, questionable syncope, possible chills at the home, feeling hot prior to fall, pains in her left hip area, some weight loss, approximately 8 pounds of a period of a few weeks, some sinus congestion, cough, as well as wheezing, as well as shortness of breath, which seems to be chronic and usually has dyspnea on exertion. Admits to some intermittent palpitations in the chest. Also feels that she has syncopized, also admits to constipation. Has dysuria for the past 3 days. Admits of some incontinence with cough. Otherwise, denies any fevers, chills, fatigue, weakness.  EYES: Denies any blurry vision, double vision, glaucoma or cataracts.  ENT: Denies tinnitus, allergies, epistaxis, sinus, dentures, difficulty swallowing.  RESPIRATORY: Denies any hemoptysis. Admits of COPD.  CARDIOVASCULAR: Denies any chest pains, orthopnea, edema, arrhythmias, palpitations.  GASTROINTESTINAL: Denies any nausea, vomiting, diarrhea, rectal bleeding, change in bowel habits.  GENITOURINARY: Denies any hematuria, increased frequency.  ENDOCRINOLOGY:  Denies any polydipsia, nocturia, thyroid problems, heat or cold intolerance or thirst. HEMATOLOGIC:  Denies anemia, easy bruising, bleeding or swollen glands. SKIN:  Denies acne, rash, lesions or change in moles.  MUSCULOSKELETAL: Denies arthritis, cramps, swelling.  NEUROLOGIC:  Denies numbness, epilepsy or tremor. PSYCHIATRY: Denies anxiety, insomnia.   PHYSICAL EXAMINATION:  VITAL SIGNS:  On arrival to the hospital, temperature is 98.6, pulse 101, respiratory rate  was 20, blood pressure 110/46, saturation was 94% on oxygen therapy.  GENERAL: This is a well-developed, well-nourished, pale and  thin female, who is somewhat anxious, lying on the stretcher.  HEENT: Her pupils are equal and reactive to light. Extraocular movements intact. No icterus or conjunctivitis. Has normal hearing. No pharyngeal erythema. Mucosa is very dry. Her lips are somewhat scaly and dry.   NECK:  No masses.  Supple, nontender. Thyroid is not enlarged. No adenopathy. No JVD or carotid bruits bilaterally. Full range of motion.  LUNGS:  Clear to auscultation bilaterally.  A few rhonchi as well as wheezes were heard. The patient is not having labored inspirations, but she does have increased effort, especially whenever she gets anxious. She is not in  acute respiratory distress.  CARDIOVASCULAR: S1, S2 appreciated.  No murmurs, gallops or rubs.  (Dictation Anomaly) Rythm was regular.  PMI not lateralized.  Chest is nontender to palpation. 1+ pedal pulses.  No lower extremity edema, clubbing, cyanosis was noted.  ABDOMEN:  Soft, nontender.  Bowel sounds are present. No hepatosplenomegaly or masses were noted.  RECTAL: Deferred.  MUSCLE STRENGTH: Able to move all 3 extremities but left lower extremity. No stenosis cyanosis or degenerative joint disease. Not able to assess for kyphosis. Gait is not tested.  SKIN: Did reveal bruising bilaterally, especially in her arms. On the right side, we  can see blanching and healing bruises; however on the left side, around elbow, the patient has a new big bruise.  No other lesions, erythema, nodularity or indurations were noted. Skin was otherwise warm and dry to palpation; however, her lower extremities were somewhat cool.  LYMPHATIC: No adenopathy in the cervical region.   NEUROLOGICAL: Cranial nerves grossly intact. Sensory is intact. No dysarthria or aphasia. PSYCHIATRIC:  The patient is alert, oriented to time, person and place, cooperative, anxious. Memory is good. No significant confusion, agitation or depression noted.   EKG is pending.   X-RAYS: The patient had portable  chest x-ray done on 03/05/2013, showed COPD, mild prominence of interstitial markings and osteoporosis with evidence of vertebral body compression fractures. Lumbar spine, AP and lateral, 03/05/2013, revealed osteoporosis, slight loss of height in L4, appears to have occurred since the previous study. MRI (Dictation Anomaly) follow up may be beneficial to assess for compression fracture. Interval vertebroplasty in L5 was also noted.  Left hip complete x-ray, 03/05/2013, showed fracture of intertrochanteric region of the proximal left femur.   ASSESSMENT AND PLAN: 1.  Syncope, fall.  We will admit the patient to the floor, telemetry. I suspect that syncope is due to some element of dehydration, as the patient is on Lasix at home. We will also follow her telemetry. Unable to get orthostatic signs, as the patient has a broken hip. We will get carotid ultrasound, as well as echocardiogram if it was not done recently.  2.  Left hip fracture. Pain controlled with fentanyl, as well as p.o. pills and also consultation will be requested.  3.  Hypokalemia, hypomagnesemia. We will supplement in ED.  We will follow levels later today.  4.  Anemia. We will follow after surgery.  5.  Chronic respiratory failure, as well as chronic obstructive pulmonary disease. Seem to be stable. We will continue home medications, as well as nebulizers every 4 hours.  6.  Ongoing tobacco abuse. Discussed cessation for approximately 4 minutes. Nicotine replacement therapy will be initiated.   TIME SPENT: 50 minutes.    ____________________________ Katharina Caper, MD rv:dmm D: 03/05/2013 12:10:00 ET T: 03/05/2013 13:17:33 ET JOB#: [id number]  cc: Katharina Caper, MD, <Dictator> Thea Alken, MD Kaid Seeberger MD ELECTRONICALLY SIGNED 03/25/2013 12:28

## 2014-09-20 NOTE — Consult Note (Signed)
PATIENT NAME:  Melody Hart, Melody Hart MR#:  [id number] DATE OF BIRTH:  [birth year]  DATE OF CONSULTATION:  02/14/2013  REFERRING PHYSICIAN:   Dr. Nemiah Commander. CONSULTING PHYSICIAN:  James P. Angie Fava., MD  CHIEF COMPLAINT:  Back and left leg pain.   HISTORY OF PRESENT ILLNESS:  The patient is a 69 year old female who reports a relatively long history of low back pain with acute exacerbation of the pain over the course of the last 3 days. She reports pain to the lower back and into the buttocks with pain that extends down the left leg to the level of the foot and ankle. She has also noted difficulty with extension of the toes of the left foot. She denies any bowel or bladder dysfunction. She denies any new trauma or fall. She does have a previous history of lumbar compression fractures. She is having difficulty standing or ambulating due to the back and left leg pain.   PAST MEDICAL HISTORY:  Chronic respiratory failure/ COPD (home oxygen), chronic low back pain with history of previous vertebral compression fractures, hypertension, hypothyroidism, depression, insomnia, tobacco use, status post hysterectomy, status post appendectomy.   MEDICATIONS AT THE TIME OF ADMISSION:  Metoprolol 25 mg b.i.d., Norco 10/325  q.3 hours as needed for pain, Prednisone 20 mg daily, ProAir inhaler 2 puffs q.2 hours p.r.n. shortness of breath, Symbicort 80/4.5, 2 puffs b.i.d., lorazepam 0.5 mg t.i.d. p.r.n. anxiety, levothyroxine 50 mcg daily, Lasix 40 mg daily, Flonase 50 mcg 2 sprays daily, DuoNebs 3 ml. q.6 hours p.r.n. shortness of breath, Atrovent inhaler 2 puffs 4 times a day.   ALLERGIES:  MORPHINE AND ADVAIR.   SOCIAL HISTORY:  The patient lives alone. She smokes approximately half-pack of cigarettes a day. Occasional alcohol use. The patient denies any illicit drug use.   FAMILY HISTORY:  Positive for COPD in mother.   MUSCULOSKELETAL REVIEW OF SYSTEMS:  Positive for severe low back pain with left lower extremity  radicular symptoms, as noted above. No joint swelling. No gross numbness, weakness or causalgia.   PHYSICAL EXAMINATION: GENERAL:  The patient is an elderly-appearing female seen in no acute distress, lying in bed.  HEENT:  Atraumatic, normocephalic. Sclerae clear.  Oropharynx is clear with moist mucosa.  NECK:  Supple, nontender, with good range of motion.  EXTREMITIES:  Gentle range of motion of the hips, knees and ankles are well tolerated.  SPINE:  Positive tenderness to palpation about the lower lumbar region and along the mid back. Flip test is equivocal. Sensory function is grossly intact to light touch. Good upper body strength is appreciated. Lower body strength is somewhat difficult to determine secondary to guarding, although there is marked left EHL weakness compared to the contralateral side. Left ankle dorsiflexion is also slightly weaker than the right. No clonus or tremor.   X-RAYS: Radiographs of the lumbar spine from [facility] were reviewed. There appears to be vertebral compression fractures at T11 and L5. Also note there are what appear to be somewhat older compression fractures at L2 and L3, as noted on previous radiographs.   IMPRESSION:  T11 and L5 vertebral compression fractures with left lower extremity radicular symptoms.   PLAN:  I would like to review the MRI of the lumbar spine to determine if there is significant retropulsion of bony fragments or disk material causing the radicular symptoms. I will also review the films with Dr. Cheri Fowler. However, given her radicular symptoms, I am not certain that the patient is even a candidate for  kyphoplasty at this time.    ____________________________ Illene LabradorJames P. Angie FavaHooten Jr., MD jph:dmm D: 02/14/2013 22:20:25 ET T: 02/14/2013 22:45:55 ET JOB#: [id number]  cc: Illene LabradorJames P. Angie FavaHooten Jr., MD, <Dictator> Illene LabradorJAMES P Angie FavaHOOTEN JR MD ELECTRONICALLY SIGNED 03/03/2013 9:34

## 2014-09-20 NOTE — H&P (Signed)
PATIENT NAME:  Melody Hart, Melody Hart MR#:  [id number] DATE OF BIRTH:  01-04-1946  DATE OF ADMISSION:  02/14/2013  ADMITTING PHYSICIAN: Gladstone Lighter, M.D.   PRIMARY CARE PHYSICIAN: Dr. Izell Hills and Dales  at [facility].   CHIEF COMPLAINT: Difficulty breathing and acute on chronic low back pain.   HISTORY OF PRESENT ILLNESS: Melody Hart is a 69 year old Caucasian female with past medical history significant for chronic respiratory failure secondary to COPD on 3 liters home oxygen, hypertension, chronic low back pain, depression, ongoing smoking and hypothyroidism presents to the hospital secondary to the above-mentioned complaints. The patient states that her symptoms started about 2 weeks ago with cold and dry cough. The cough symptoms got better, her breathing got worse and she was not able to breathe well over the past couple of weeks. She lives at home by herself and usually has dyspnea on minimal exertion, but it got worse to the point that she could not breathe even at rest and could not sleep well. On top of this, her low back pain got suddenly worse yesterday. She was screaming in bed and could not even get out of bed to use her bathroom because the pain. She denies any fall or injury to her back. She does have a history of compression fractures from being on chronic prednisone treatment in the past. She was taken to [facility] by her grandson yesterday but the wait time was greater than 8 hours and she said she could not sit in the wheelchair that long and came back home. Her pain did not get any better. Her breathing got worse so she presented to the ER this morning. She called EMS this morning and when they checked, her sats were in the 80s on her chronic 3 liters of oxygen therapy. In the ER, she received IV Solu-Medrol, neb treatments in spite of which she was wheezing and down currently on 4 liters oxygen and still complaining of low back pain.    PAST MEDICAL HISTORY: 1.  Chronic respiratory failure secondary to  COPD, on 3 liters home oxygen.  2.  Chronic low back pain.  3.  Hypertension.  4.  Hypothyroidism.  5.  Depression.  6.  Insomnia.  7.  Ongoing tobacco use.   PAST SURGICAL HISTORY: 1.  Hysterectomy.  2.  Appendectomy.   ALLERGIES TO MEDICATIONS: MORPHINE AND ADVAIR   CURRENT HOME MEDICATIONS: Include:  1.  Metoprolol 25 mg p.o. b.i.d.  2.  Norco 325/10 mg 1 tablet p.o. q. 3 hours p.r.n. for pain.  3.  Prednisone 20 mg p.o. daily.  4.  ProAir inhaler 2 puffs every 2 hours as needed for shortness of breath.  5.  Symbicort 80/4.5 two puff b.i.d.  6.  Lorazepam 0.5 mg p.o. 3 times a day as needed for anxiety.  7.  Levothyroxine 50 mcg p.o. daily.  8.  Lasix 40 mg p.o. daily.  9.  Flonase 50 mcg 2 sprays once a day in the morning.  10.  DuoNebs 3 mL q. 6 hours p.r.n. for shortness of breath.  11.  Atrovent inhaler 2 puffs 4 times a day.   SOCIAL HISTORY: Lives at home by herself. She continues to smoke about 1/2 pack per day. She  used to smoke more than that in the past.  Occasional alcohol use. No drug use.   FAMILY HISTORY: Mom with COPD and died from failure to thrive.  The patient does not know anything about her dad.  REVIEW OF SYSTEMS:  CONSTITUTIONAL: No fever.  No fatigue or weakness.  EYES: No blurred vision, double vision, pain, inflammation or glaucoma. Uses reading glasses.  ENT: No tinnitus or ear pain. Positive for mild hearing loss in the right ear. No epistaxis or discharge.  RESPIRATORY: Positive for cough and wheezing and COPD.  No hemoptysis.  CARDIOVASCULAR: No chest pain. Positive for orthopnea. No edema, palpitations or syncope.  GASTROINTESTINAL: No nausea, vomiting, diarrhea, abdominal pain, hematemesis or melena.  GENITOURINARY: No dysuria, hematuria, renal calculus, frequency or incontinence.  ENDOCRINE: No polyuria, nocturia, thyroid problems, heat or cold intolerance.  HEMATOLOGY: No anemia, easy bruising or bleeding.  SKIN: No acne, rash or lesions.   MUSCULOSKELETAL: Positive for severe low back pain with shooting pain through the left leg. No arthritis or gout.  NEUROLOGIC: No numbness, weakness, CVA, TIA or seizures.  PSYCHOLOGICAL: Positive for anxiety no insomnia, depression.   PHYSICAL EXAMINATION: VITAL SIGNS: Temperature 97.4 degrees Fahrenheit, pulse 110, respirations 26, blood pressure 116/59, pulse oximetry 94% on room air.  GENERAL: A thin built, well-nourished female lying in bed, not in any acute distress.  HEENT: Normocephalic, atraumatic. Pupils equal, round, reacting to light. Anicteric sclerae. Extraocular movements intact. Oropharynx clear without erythema, mass or exudates.  NECK: Supple. No thyromegaly, JVD or carotid bruits. No lymphadenopathy.  LUNGS: She has scattered both inspiratory and expiratory wheeze bilaterally. Decreased bibasilar breath sounds.  No wheeze. No crackles or rhonchi heard.    HEART: S1 and S2 regular rate and rhythm. A 3/6 systolic murmur. No rubs or gallops.  ABDOMEN: Soft, nontender, nondistended. No hepatosplenomegaly. Normal bowel sounds.  EXTREMITIES: Positive for 1+ ankle edema worse on the left leg. 2+ dorsalis pedis pulses palpable bilaterally.  SKIN: No acne, rash or lesions.  LYMPHATICS: No cervical or inguinal lymphadenopathy. NEUROLOGIC: The patient's cranial nerves II through IV remain intact. Gross motor strength is 5/5 in both upper extremities. Lower extremity testing is limited secondary to pain; however, seems to be able to position herself and seems 5/5 in both lower extremities. Deep tendon reflexes are 2+ and symmetric bilaterally.  Sensation remains intact.  MUSCULOSKELETAL: She has tenderness in the sacral region worse on the left side and shooting pain from the back along her hamstrings.  Has history of sciatica in the past, but this feels completely different and extremely tender. No visible hematoma is noted.  PSYCHOLOGICAL: The patient is awake, alert, oriented x 3.    LABORATORY DATA: WBC 11.6, hemoglobin 14.3, hematocrit 41.5, platelet count 326.   Sodium 135, potassium 2.9, chloride 92, bicarbonate 38, BUN 12, creatinine 0.87, glucose 115, calcium of 9.1.   ALT 27, AST 30, alk phos 197, total bilirubin 0.7, albumin of 3.1. Troponins negative. CK 96, CK-MB 5.7. Chest x-ray done showing hyperinflated lung fields consistent with COPD and acute bronchitis. No evidence of pneumonia or CHF. EKG showing sinus tachycardia, heart rate of 109.   ASSESSMENT AND PLAN: This is a 69 year old female with history of chronic respiratory failure secondary to chronic obstructive pulmonary disease, on 3 liters home oxygen with ongoing smoking, high blood pressure, chronic low back pain, hypothyroidism. Comes to ER for worsening breathing and hypoxic on her 3 liters and also complaining of acute on chronic low back pain.  1.  Acute on chronic obstructive pulmonary disease exacerbation with early bronchitis. Will admit, start IV Solu-Medrol, DuoNebs. Continue Symbicort, Spiriva and oxygen support as needed.  I will start Levaquin for her bronchitis.  2.  Acute on chronic low back pain. Since she is on chronic  prednisone, we will get lumbar and sacral x-rays to rule out any pathological factors. It could be worsening spinal stenosis with sciatica as well. Continue p.r.n. Norco for now.  The patient is ALLERGIC TO MORPHINE so I gave her a dose of Toradol before the x-rays.  Physical therapy evaluation and management and care management consult.  3.  Hypothyroidism. Continue Synthroid.  4.  Hyperkalemia secondary to being on Lasix at home and not taking her potassium chloride supplements. Can hold Lasix at this time as she does not appear to be fluid overloaded and replace potassium.  5.  Hypertension. Continue metoprolol.  6.  Tobacco use disorder. Counseled for 3 minutes while in the ER.  Not ready to quit.  We will start on Nicotrol inhaler while in the hospital.   7.   Gastrointestinal and deep venous thrombosis prophylaxis.   CODE STATUS: FULL CODE.  TIME SPENT ON ADMISSION: 50 minutes.     ____________________________ Gladstone Lighter, MD rk:dp D: 02/14/2013 11:43:49 ET T: 02/14/2013 12:32:08 ET JOB#: [id number]  cc: Gladstone Lighter, MD, <Dictator> Juline Patch, MD Gladstone Lighter MD ELECTRONICALLY SIGNED 02/15/2013 10:37

## 2014-09-20 NOTE — Consult Note (Signed)
   Comments   I met with pt's 2 sisters, brother and 2 grandsons, including grandson, Elberta Fortis, with whom pt lives. I explained the options of continued aggressive care with trach/PEG/LTACH vs extubation with no plan to reintubate. Family says that pt had told them all that she would rather be dead than be in a nursing facility. She had also said that she never wanted to have a trach. After much discussion, family decided against trach. They would like every attempt made for a successful extubation with continued weaning trials. They realize that pt will need to be extubated by the end of this week.  discussed code status with family. They would not want pt to have CPR/defibrillation as she has severe osteoporosis and they fear pt would have fragility fractures. Code status changed to limited code.   Electronic Signatures: Sennie Borden, Izora Gala (MD)  (Signed 29-Dec-14 13:07)  Authored: Palliative Care   Last Updated: 29-Dec-14 13:07 by Sarahelizabeth Conway, Izora Gala (MD)

## 2014-09-20 NOTE — Consult Note (Signed)
Present Illness 69 yo female with history of COPD on home O2, HTN, hypothyroidism, depression, ongoing tobacco abuse admitted with respiratory distress. She is now intubated. Upon presentation, noted to be tachcyardia, likely SVT. Converted to sinus tachycardia with adenosine x 2 and IV Cardizem. No family available to provide history   Tele: sinus tach, rate 110 bpm  PMH: COPD on home O2, HTN, hypothyroidism, depression, ongoing tobacco abuse   PSH: Hysterectomy Appendectomy Hip surgery  FH: Mother-COPD Unable to obtain history about possible CAD  SH: Ongoing tobacco abuse, 1/2 ppd No alcohol or drugs  Allergies: Morphine and Advair  Current Meds: see computer list  ROS: unable to obtain   Physical Exam:  GEN well developed, well nourished, intubated   NECK supple  No masses   RESP clear BS  intubated   CARD Tachycardic  Normal, S1, S2  No murmur   ABD soft  normal BS   EXTR negative edema   SKIN normal to palpation   NEURO sedated   PSYCH sedated   Review of Systems:  Subjective/Chief Complaint unable to obtain, pt sedated/intubated   Home Medications: Medication Instructions Status  acetaminophen-HYDROcodone 325 mg-5 mg oral tablet 1 tab(s) orally every 6 hours Active  oxyCODONE 5 mg oral tablet 1 tab(s) orally every 4 hours, As Needed, pain , As needed, pain Active  traZODone 300 mg oral tablet 1 tab(s) orally once a day (at bedtime) Active  LORazepam 0.5 mg oral tablet 1 tab(s) orally 3 times a day, As Needed - for Anxiety, Nervousness Active  venlafaxine 75 mg oral tablet 1 tab(s) orally once a day (at bedtime) Active  pantoprazole 40 mg oral delayed release tablet 1 tab(s) orally once a day Active  ProAir HFA CFC free 90 mcg/inh inhalation aerosol 2 puff(s) inhaled every 2 hours, As Needed - for Shortness of Breath Active  DuoNeb 2.5 mg-0.5 mg/3 mL inhalation solution 3 milliliter(s) inhaled every 6 hours, As Needed - for Shortness of Breath Active   Atrovent HFA CFC free 17 mcg/inh inhalation aerosol 2 puff(s) inhaled once a day Active  Symbicort 80 mcg-4.5 mcg/inh inhalation aerosol 2 puff(s) inhaled once a day Active  methimazole 1 tab(s) orally once a day Active  predniSONE 20 mg oral tablet 2 tabs (68m) orally once a day Active  metoprolol 50 mg oral tablet 1 tab(s) orally 2 times a day Active   Lab Results: Routine Micro:  20-Dec-14 00:34   Micro Text Report BLOOD CULTURE   COMMENT                   NO GROWTH IN 8-12 HOURS   ANTIBIOTIC                       Culture Comment NO GROWTH IN 8-12 HOURS  Result(s) reported on 19 May 2013 at 09:00AM.    01:05   Micro Text Report BLOOD CULTURE   COMMENT                   NO GROWTH IN 8-12 HOURS   ANTIBIOTIC                       Culture Comment NO GROWTH IN 8-12 HOURS  Result(s) reported on 19 May 2013 at 09:00AM.  Lab:  20-Dec-14 00:58   pH (ABG)  7.30  PCO2  78  PO2  378  FiO2 100  Base Excess  8.8  HCO3  38.4  O2 Saturation 98.5  O2 Device vent  Specimen Site (ABG) RT RADIAL  Specimen Type (ABG) ARTERIAL  Patient Temp (ABG) 37.0  Vt 450  PEEP 5.0  Mechanical Rate 14  Lactic Acid, Cardiopulmonary  2.1 (Result(s) reported on 19 May 2013 at 01:09AM.)    04:30   pH (ABG)  7.31  PCO2  66  PO2  139  FiO2 40  Base Excess  4.8  HCO3  33.2  O2 Saturation 97.6  O2 Device [id number]  Specimen Site (ABG) RT RADIAL  Specimen Type (ABG) ARTERIAL  Patient Temp (ABG) 37.0  Mode ASSIST CONTROL  Vt 400  PEEP 5.0  Mechanical Rate 14 (Result(s) reported on 19 May 2013 at 04:41AM.)  Routine Chem:  20-Dec-14 00:33   Potassium, Serum  2.8  Magnesium, Serum 1.9 (1.8-2.4 THERAPEUTIC RANGE: 4-7 mg/dL TOXIC: > 10 mg/dL  -----------------------)  Glucose, Serum  160  BUN 11  Creatinine (comp) 0.62  Sodium, Serum 140  Chloride, Serum  97  CO2, Serum  39  Calcium (Total), Serum 9.0  Anion Gap  4  Osmolality (calc) 282  eGFR (African American) >60  eGFR (Non-African  American) >60 (eGFR values <63m/min/1.73 m2 may be an indication of chronic kidney disease (CKD). Calculated eGFR is useful in patients with stable renal function. The eGFR calculation will not be reliable in acutely ill patients when serum creatinine is changing rapidly. It is not useful in  patients on dialysis. The eGFR calculation may not be applicable to patients at the low and high extremes of body sizes, pregnant women, and vegetarians.)    00:58   Result Comment - READ-BACK PROCESS PERFORMED.  - NOTIFIED OF CRITICAL VALUE  - dr brown at 0170012/20/14. emk  Result(s) reported on 19 May 2013 at 01:09AM.    08:09   Potassium, Serum  2.9 (Result(s) reported on 19 May 2013 at 09:14AM.)  Cardiac:  20-Dec-14 00:33   Troponin I < 0.02 (0.00-0.05 0.05 ng/mL or less: NEGATIVE  Repeat testing in 3-6 hrs  if clinically indicated. >0.05 ng/mL: POTENTIAL  MYOCARDIAL INJURY. Repeat  testing in 3-6 hrs if  clinically indicated. NOTE: An increase or decrease  of 30% or more on serial  testing suggests a  clinically important change)    04:58   Troponin I 0.02 (0.00-0.05 0.05 ng/mL or less: NEGATIVE  Repeat testing in 3-6 hrs  if clinically indicated. >0.05 ng/mL: POTENTIAL  MYOCARDIAL INJURY. Repeat  testing in 3-6 hrs if  clinically indicated. NOTE: An increase or decrease  of 30% or more on serial  testing suggests a  clinically important change)  CK, Total 77  CPK-MB, Serum  4.1 (Result(s) reported on 19 May 2013 at 05:36AM.)    08:09   Troponin I < 0.02 (0.00-0.05 0.05 ng/mL or less: NEGATIVE  Repeat testing in 3-6 hrs  if clinically indicated. >0.05 ng/mL: POTENTIAL  MYOCARDIAL INJURY. Repeat  testing in 3-6 hrs if  clinically indicated. NOTE: An increase or decrease  of 30% or more on serial  testing suggests a  clinically important change)  CK, Total 91  CPK-MB, Serum  4.8 (Result(s) reported on 19 May 2013 at 09:19AM.)  Routine Hem:  20-Dec-14 00:33   WBC  (CBC)  13.2  RBC (CBC) 4.82  Hemoglobin (CBC) 14.3  Hematocrit (CBC) 45.1  Platelet Count (CBC) 329 (Result(s) reported on 19 May 2013 at 01:24AM.)  MCV 94  MCH 29.6  MCHC  31.7  RDW  14.8  EKG:  EKG Interp. by me   Interpretation Sinus tach, rate 133 bpm   Rate 133   Additional Comments Inferior Q-waves   Radiology Results: XRay:    20-Dec-14 00:47, Chest Portable Single View  Chest Portable Single View   REASON FOR EXAM:    intubation  COMMENTS:       PROCEDURE: DXR - DXR PORTABLE CHEST SINGLE VIEW  - May 19 2013 12:47AM     CLINICAL DATA:  Shortness of breath-intubation.    EXAM:  PORTABLE CHEST - 1 VIEW    COMPARISON:  05/05/2013 and prior chest radiographs    FINDINGS:  The cardiomediastinal silhouette is unremarkable.  An endotracheal tube is now noted with tip 3.6 cm above the carina.    COPD/emphysema again identified.    There is no evidence of focal airspace disease, pulmonary edema,  suspicious pulmonary nodule/mass, pleural effusion, or pneumothorax.  No acute bony abnormalities are identified.     IMPRESSION:  COPD/ emphysema and endotracheal tube placement without other  significant abnormality.      Electronically Signed    By: Hassan Rowan M.D.    On: 05/19/2013 01:42         Verified By: Lura Em, M.D.,    Morphine: Resp Arrest  Advair Diskus: Unknown  Vital Signs/Nurse's Notes: **Vital Signs.:   20-Dec-14 09:00  Vital Signs Type Routine  Pulse Pulse 118  Pulse source if not from Vital Sign Device per cardiac monitor  Respirations Respirations 21  Diastolic BP (mmHg) Diastolic BP (mmHg) 48  Mean BP 67  BP Source  if not from Vital Sign Device non-invasive  Pulse Ox % Pulse Ox % 96  Pulse Ox Activity Level  At rest  Oxygen Delivery Ventilator Assisted  Pulse Ox Heart Rate 118  *Intake and Output.:   20-Dec-14 03:59  Weight Type admission  Weight Method Bed  Current Weight (lbs) (lbs) 108.4  Current Weight (kg) (kg)  49.2    06:00  Grand Totals Intake:  380 Output:      Net:  380 24 Hr.:  380  IV (Primary)      In:  100  IV (Secondary)      In:  280    07:00  Grand Totals Intake:  314.7 Output:  875    Net:  -560.3 24 Hr.:  -180.3  IV (Primary)      In:  100  IV (Primary)      In:  14.7  IV (Secondary)      In:  200  Urine ml     Out:  875  Urinary Method  Foley    Shift 07:00  Grand Totals Intake:  694.7 Output:  875    Net:  -180.3 24 Hr.:  -180.3  IV (Primary)      In:  200  IV (Primary)      In:  14.7  IV (Secondary)      In:  480  Urine ml     Out:  875  Length of Stay Totals Intake:  694.7 Output:  875    Net:  -180.3    Daily 07:00  Grand Totals Intake:  694.7 Output:  875    Net:  -180.3 24 Hr.:  -180.3  IV (Primary)      In:  200  IV (Primary)      In:  14.7  IV (Secondary)      In:  480  Urine ml  Out:  875  Length of Stay Totals Intake:  694.7 Output:  875    Net:  -180.3    08:00  Grand Totals Intake:  24.7 Output:      Net:  24.7 24 Hr.:  24.7  Diprivan      In:  14.7  IV (Primary)      In:  10  IV (Primary)      In:  0    09:33  Grand Totals Intake:  24.7 Output:      Net:  24.7 24 Hr.:  49.4  Diprivan      In:  14.7  IV (Primary)      In:  10    Shift 15:00  Grand Totals Intake:  49.4 Output:      Net:  49.4 24 Hr.:  49.4  Diprivan      In:  29.4  IV (Primary)      In:  20  IV (Primary)      In:  0  Length of Stay Totals Intake:  744.1 Output:  875    Net:  -130.9    Impression 1. SVT: Pt admitted with narrow complex tachycardia, c/w SVT. Now sinus tach. Likely caused by her underlying lung disease and respiratory failure. Currently sedated and intubated. Troponin is negative. Agree with cycling enzymes. Would check echo to assess LVEF. Would use IV Cardizem as needed for rate control. No further recs at this time. Will follow with you.   Electronic Signatures: Burnell Blanks (MD)  (Signed 20-Dec-14 10:12)  Authored: General  Aspect/Present Illness, History and Physical Exam, Review of System, Home Medications, Labs, EKG , Radiology, Allergies, Vital Signs/Nurse's Notes, Impression/Plan   Last Updated: 20-Dec-14 10:12 by Burnell Blanks (MD)

## 2014-09-20 NOTE — Discharge Summary (Signed)
PATIENT NAME:  Melody Hart, Melody Hart MR#:  [id number] DATE OF BIRTH:  [birth year]  DATE OF ADMISSION:  02/14/2013 DATE OF DISCHARGE:  02/20/2013   REASON FOR ADMISSION: Difficulty breathing and acute on chronic low back pain.   DISCHARGE DIAGNOSES:  1. Multilevel compression fractures at T11, L5, L2, L3 and L4.  2. Chronic obstructive pulmonary disease exacerbation.  3. Chronic pain.  4. Tobacco abuse.  5. Depression.  6. Panic attacks.  7. Osteoporosis with pathologic compression fractions.  8. Fibromyalgia.  9. Hypertension.  10. Hypothyroidism.   PROCEDURES: L5 and T11 kyphoplasty with biopsy. Surgeon: Dr. Kennedy BuckerMichael Menz. Date of procedure: 02/16/2013.   IMPORTANT RESULTS:  MRI showing multilevel compression fractures which are likely subacute. There is some mild edema present. There is spinal canal narrowing prominent at the levels of L5 and L4. This presumes that the inferior-most compressed lumbar vertebral body is L5. Additional areas of spinal canal narrowing present at L2-L3, L1-L2, T10-T11; correlate clinically for significance.  Sodium 135 on admission, 139 on discharge. Potassium was 2.9 at admission, 3.5 at discharge. LFTs with mild increase in alkaline phosphatase to 197. Troponins were negative. White count was 11.6 on admission, 12.9 at discharge. The patient on steroids. Hemoglobin was 12.7.  Pathology report:  Degenerative bone consistent with fracture, bone morrow with trilineage hematopoiesis present, negative for malignancy.   EKG: Sinus tachycardia.   MEDICATIONS AT DISCHARGE:  1. Metoprolol 25 mg twice daily.  2. Prednisone 20 mg once daily, which is a chronic dose.  3. Levothyroxine 50 mcg once a day.  4. Furosemide 20 mg 2 times a day. 5. ProAir HFA 90 mcg 2 puffs every 2 hours. 6. Symbicort 80/4.5 mcg 2 puffs 2 times a day. 7. Atrovent HFA 2 puffs every 4 hours.  8. Flonase 50 mcg once at night. 9. DuoNebs 2.5/0.5 mg every 6 hours.  10. Albuterol HFA as needed  for shortness of breath. 11. Tylenol p.r.n. high temperatures. 12. Norco 325/10 mg 1 every 3 hours as needed for pain.  13. Lovenox 40 mg once a day.  14. Trazodone 100 mg at bedtime.  15. Venlafaxine 75 mg once a day.  16. Lorazepam 0.5 mg 3 times daily for anxiety. 17. Spiriva 18 mcg once a day.  18. Calcitonin nasal spray 1 spray every night to alternating nostrils. 19. Protonix 40 mg once a day.  20. Levofloxacin 250 mg every day for 5 more days.  21. Nicotine replacement therapy as needed. 22. Oxycodone 5 mg 1 tablet every 6 hours as needed for pain.   FOLLOWUP: 1. Follow up with Dr. Kennedy BuckerMichael Menz in 2 weeks at [facility].  2. Continue physical therapy as tolerated.   DISPOSITION: To skilled nursing facility.   HOSPITAL COURSE: A 69 year old female with history of osteoporosis, COPD, chronic respiratory failure, on 3 liters nasal cannula, chronic low back pain, hypertension, hypothyroidism, depression, insomnia, ongoing tobacco use, who comes with a history of acute low back pain and increased difficulty breathing. The patient had symptoms starting 2 weeks ago with cold and dry cough, getting worse every day, significant dyspnea with minimal exertion and increased respiratory secretions. She denied any fall or injury, but she does have history of previous compression fractures. She has been on chronic treatment with prednisone up to 20 mg a day. She felt like the pain was getting worse and the breathing was getting worse, for which she came to the ER. In the ER, she was diagnosed with acute exacerbation of her COPD,  and for her pain, she got several tests, including x-rays. X-rays showed compression fractures, more evident in T11 and L5, for which the patient was admitted, evaluated and treated.   1. As far as acute on chronic obstructive pulmonary disease exacerbation, the patient was given Solu-Medrol IV and then switched to prednisone taper. The patient had a lot of trouble for the first  couple days due to the pain and posture worsening her respiration status. The patient started improving, secretions decreased, cough decreased, and the Solu-Medrol was changed to prednisone. She was started on Levaquin, which is to continue for 5 more days. She continues Symbicort and Spiriva. She is back to her home O2 baseline oxygen, which is 3 liters of oxygen.  2. As far as her acute pain, the patient was diagnosed with multilevel compression fractures. Overall, she did choose to go for a kyphoplasty, which was done by Dr. Rosita Kea. The patient did not have any significant improvement right away. She actually had more pain, and she is complaining of more pain in her lower extremities. The patient had a lot of trouble getting out of bed at the beginning, and the first evaluation by PT recommended home with 24-hour care, but the patient, after kyphoplasty, was worse, for which now she is going to skilled nursing facility. Calcitonin was added on for pain control and osteoporosis treatment. She is going to a skilled nursing facility today.  3. As far as her hypothyroidism, it was controlled on Synthroid. 4. The patient had hypokalemia as another diagnosis, which was secondary to dehydration and not eating much. It was replaced, and now her levels are overall normal.  5. Hypertension. The patient continued to take metoprolol.  6. For deep vein thrombosis prophylaxis, continue Lovenox until the patient is able to get up and move around a little bit more.   TIME SPENT: I spent about 45 minutes with this discharge today.   CODE STATUS: The patient is a full code.     ____________________________ Felipa Furnace, MD rsg:OSi D: 02/20/2013 13:49:24 ET T: 02/20/2013 14:16:18 ET JOB#: [id number]  cc: Felipa Furnace, MD, <Dictator> Thea Alken, MD Leitha Schuller, MD Pearletha Furl MD ELECTRONICALLY SIGNED 02/22/2013 11:07

## 2014-09-21 NOTE — Op Note (Signed)
PATIENT NAME:  Melody RileyEACE, Kadedra MR#:  [id number] DATE OF BIRTH:  Oct 06, 1945  DATE OF PROCEDURE:  05/22/2013  PREOPERATIVE DIAGNOSES:   1.  Respiratory failure, requiring intubation and ventilation.  2.  Chronic obstructive pulmonary disease exacerbation.  3.  Pneumonia.   POSTOPERATIVE DIAGNOSES:  1.  Respiratory failure, requiring intubation and ventilation.  2.  Chronic obstructive pulmonary disease exacerbation.  3.  Pneumonia.   PROCEDURES:  1. Ultrasound guidance for vascular access to left basilic vein.  2. Fluoroscopic guidance for placement of catheter.  3. Insertion of peripherally inserted central venous catheter, a triple-lumen 6-French PICC line, left arm.   SURGEON: Renford DillsGregory G Melanie Openshaw, M.D.   ANESTHESIA: Local.   ESTIMATED BLOOD LOSS: Minimal.   INDICATION FOR PROCEDURE:  1.  The patient receiving vesicant or irritant drugs for greater than 5 days.  2.  The patient with respiratory failure and intubation requiring multiple parenteral medications for sustaining life.   DESCRIPTION OF PROCEDURE: The patient's left arm was sterilely prepped and draped, and a sterile surgical field was created. The basilic vein was accessed under direct ultrasound guidance without difficulty with a micropuncture needle and permanent image was recorded. 0.018 wire was then placed into the superior vena cava. Peel-away sheath was placed over the wire. A single lumen peripherally inserted central venous catheter was then placed over the wire and the wire and peel-away sheath were removed. The catheter tip was placed into the superior vena cava and was secured at the skin at 39 cm with a sterile dressing. The catheter withdrew blood well and flushed easily with heparinized saline. The patient tolerated procedure well.   ____________________________ Renford DillsGregory G. Christerpher Clos, MD ggs:np D: 05/23/2013 15:59:53 ET T: 05/23/2013 19:05:46 ET JOB#: [id number]  cc: Renford DillsGregory G. Brenyn Petrey, MD, <Dictator> Renford DillsGREGORY G  Dwon Sky MD ELECTRONICALLY SIGNED 06/05/2013 19:31

## 2014-09-21 NOTE — Discharge Summary (Signed)
PATIENT NAME:  Melody Hart, Melody Hart MR#:  [id number] DATE OF BIRTH:  1945/11/04  DATE OF ADMISSION:  05/19/2013 DATE OF INTERIM DISCHARGE SUMMARY:  05/25/2013    This interim covers from 12/20 to 12/26.    ADMITTING DIAGNOSIS: Shortness of breath.   DISCHARGE DIAGNOSES: 1.  Acute respiratory failure due to acute on chronic obstructive pulmonary disease exacerbation.  2.  Acute hypercarbic hypoxic respiratory requiring ventilation, continues to be intubated.  3.  History of chronic systolic congestive heart failure without evidence of acute congestive heart failure.  4.  Anxiety disorder.  5.  History of hypothyroidism on Synthroid.  6.  Sinus tachycardia due to acute respiratory failure now improved.  7.  Severe hypokalemia, status post replacement.  8.  Continued nicotine abuse.  9.  Chronic lower back pain.  10.  Insomnia.  11.  Status post hysterectomy.  12.  Status post appendectomy.  13.  Recent hip fracture requiring pinning.   CONSULTANTS: Dr. Kirke CorinArida and Dr. Belia HemanKasa.   PERTINENT LABORATORIES AND EVALUATIONS: Admitting EKG showed supraventricular tachycardia with PVCs. Magnesium 1.9, glucose 160, BUN 11, creatinine 0.62, sodium 140, potassium 2.8, chloride 97, CO2 39. Troponin less than 0.02. WBC 13.2, hemoglobin 14.3, platelet count 329. Blood culture showed no growth. ABG: PH 7.30, pCO2 of 78, pO2 of 378. EKG: Sinus tachycardia, left axis deviation. Sputum culture no growth.  PSA 0.327. Chest x-ray showed small bilateral effusion.  Recent ABG today: PH of 7.46, pCO2 of 59, pO2 of 70.   HOSPITAL COURSE: The patient is a 69 year old white female with history of chronic respiratory failure, who has history of COPD, who is on chronic 3 liters of O2 who has been having respiratory difficulties ongoing for the past few days. She was giving herself nebulizers very frequently and heart rate was in the 180s. The patient was seen in the ED and was noted to be in acute respiratory failure. She was  tachypneic, tachycardic and had to be intubated. The patient has since remained on the ventilator. She has been receiving IV antibiotics and has been followed by Dr. Belia HemanKasa. Spontaneous breathing trials have been unsuccessful. Due to patient's work of breathing, therefore, she is continued on the ventilator. The patient has been very slow to improve. Palliative care consult was obtained today. At this time, we will continue to maintain her on the ventilator. The patient had to be placed on insulin drip due to hyperglycemia as a result of her tube feeds and her steroids.   CURRENT MEDICATIONS: Fentanyl drip for sedation, Versed drip, chlorhexidine 5 mL q. 12, levothyroxine 0.05 mg daily, metoprolol 5 mg IV q. 6, ceftriaxone 1 gram IV q. 24, Solu-Medrol  , hydralazine 10 IV q. 6 p.r.n. systolic blood pressure greater than 170, diltiazem 60 q. 6, Colace 100 b.i.d., Lovenox 40 q. 24, Famotadine20 q. 24, lactulose 30 b.i.d., lorazepam 4 mg IV q. 1 p.r.n. Senna 1 tab p.o. b.i.d. NOTE:  35 minutes spent on the patient.    ____________________________ Lacie ScottsShreyang H. Allena KatzPatel, MD shp:dp D: 05/25/2013 15:31:53 ET T: 05/25/2013 16:17:24 ET JOB#: [id number]  cc: Izela Altier H. Allena KatzPatel, MD, <Dictator> Charise CarwinSHREYANG H Torria Fromer MD ELECTRONICALLY SIGNED 06/03/2013 8:32

## 2014-09-21 NOTE — Discharge Summary (Signed)
PATIENT NAME:  Melody Hart, BERISH MR#:  [id number] DATE OF BIRTH:  [birth year]  DATE OF ADMISSION:  05/19/2013 DATE OF DISCHARGE: 06/06/2013   This dictation should cover from January 04 until January 07. For previous hospitalization and previous hospital course, please see the admission H and P and interim discharge summaries dictated on January 04 by Dr. Luberta Mutter and December 26 by Dr. Auburn Bilberry.   DISCHARGE DIAGNOSES:  1.  Acute on chronic respiratory failure secondary to chronic obstructive pulmonary disease exacerbation.  2.  Sinus tachycardia/atrial fibrillation/supraventricular tachycardia, currently sinus.  3.  Chronic systolic congestive heart failure, ejection fraction of 45%.  4.  Hypothyroidism.  5.  Hypokalemia.  6.  Dysphagia.  7.  Deconditioning.  8.  Ongoing tobacco abuse.  9.  Chronic lower back pain.  10. Hypertension.  11. Depression.  12. Insomnia.   DISCHARGE MEDICATIONS: Duo-Neb every 6 hours as needed for shortness of breath,  Atrovent 2 puffs once a day, trazodone 300 mg at bedtime, gabapentin 300 mg 2 times a day, prednisone 20 mg daily, Premarin 0.625 mg once a day, Nexium 40 mg daily, potassium chloride 10 mEq 2 times a day, Lasix 20 mg daily, Flonase 50 mcg 2 sprays once a day, Fosamax 70 mg once a week, oxycodone 5 mg every 6 hours as needed for pain, amiodarone 200 mg 2 times a day, Symbicort 160/4.5 mcg 2 puffs 2 times a day, levothyroxine 50 mcg daily and Spiriva 18 mcg daily. She will be going home with a Foley as well at 3 L nasal cannula continuous.   DIET: Low sodium. Consistency, pureed with nectar thick liquids by tsp. Strict aspiration precautions with breaks frequently. All p.o. by  tsp. only.  ACTIVITY: As tolerated.   FOLLOWUP: Referral to palliative care. Please follow with PCP within 1 to 2 weeks.   HOSPITAL COURSE: Since the 4th, the patient has done fairly well. She has remained extubated. She is still short of breath, but significantly  improved. We have been tapering the steroids and have added Spiriva and Symbicort back to her regimen. She has chronic respiratory failure and is on 3 L around-the-clock. In regards to her dysphagia, she is eating more daily and feels more strengthened, but she is severely deconditioned and weak. She will be discharged on amiodarone for her A. fib. and SVT and currently she is in sinus. If tolerated, perhaps she can be started on low-dose Cardizem and I think at this point, the blood pressure can tolerate and she can be started on low-dose, long-acting diltiazem. We will start her on diltiazem 120 mg. Initially we thought about perhaps a hospice home, but at this point the family is more interested in SNF with palliative care and helping from that regard at SNF and that has been provided for her. At this point, she will be discharged with outpatient followup with the help of palliative care as an outpatient.   On the day of discharge, temperature is 98.8, pulse rate is 101, respiratory rate 16, blood pressure 140/50 with oxygen saturation of 90% to 93% on 4 L. Generally, the patient is chronically ill-appearing, debilitated female sitting on bed, hoarse but talking in full sentences. Lungs have improved air entry compared to yesterday, but still coarse and mild basilar wheezing. Abdomen is soft, nontender, nondistended. Extremities show no significant pitting edema.   CODE STATUS: The patient is DO NOT RESUSCITATE/ DO NOT INTUBATE.  TOTAL TIME SPENT: 40 minutes on this discharge.  ____________________________ Krystal Eaton, MD  sa:aw D: 06/06/2013 10:59:58 ET T: 06/06/2013 11:18:55 ET JOB#: [id number]  cc: Krystal EatonShayiq Rexann Lueras, MD, <Dictator> Krystal EatonSHAYIQ Future Yeldell MD ELECTRONICALLY SIGNED 06/12/2013 11:35

## 2021-07-01 DEATH — deceased
